# Patient Record
Sex: Male | Born: 1940
Health system: Southern US, Community
[De-identification: ages and names within clinical notes are randomized; demographics above are authoritative.]

## PROBLEM LIST (undated history)

## (undated) DIAGNOSIS — F1010 Alcohol abuse, uncomplicated: Secondary | ICD-10-CM

## (undated) DIAGNOSIS — I10 Essential (primary) hypertension: Secondary | ICD-10-CM

## (undated) HISTORY — PX: LEG SURGERY: SHX1003

## (undated) HISTORY — PX: COLONOSCOPY: SHX174

---

## 2003-01-16 ENCOUNTER — Encounter: Payer: Self-pay | Admitting: Emergency Medicine

## 2003-01-16 ENCOUNTER — Inpatient Hospital Stay (HOSPITAL_COMMUNITY): Admission: EM | Admit: 2003-01-16 | Discharge: 2003-01-22 | Payer: Self-pay | Admitting: Emergency Medicine

## 2003-01-16 ENCOUNTER — Encounter: Payer: Self-pay | Admitting: Orthopedic Surgery

## 2003-01-19 ENCOUNTER — Encounter: Payer: Self-pay | Admitting: Orthopedic Surgery

## 2003-03-06 ENCOUNTER — Encounter: Payer: Self-pay | Admitting: Orthopedic Surgery

## 2003-03-06 ENCOUNTER — Ambulatory Visit (HOSPITAL_COMMUNITY): Admission: RE | Admit: 2003-03-06 | Discharge: 2003-03-06 | Payer: Self-pay | Admitting: Orthopedic Surgery

## 2003-05-09 ENCOUNTER — Ambulatory Visit (HOSPITAL_COMMUNITY): Admission: RE | Admit: 2003-05-09 | Discharge: 2003-05-10 | Payer: Self-pay | Admitting: Orthopedic Surgery

## 2003-05-09 ENCOUNTER — Encounter: Payer: Self-pay | Admitting: Orthopedic Surgery

## 2003-06-11 ENCOUNTER — Encounter (HOSPITAL_COMMUNITY): Admission: RE | Admit: 2003-06-11 | Discharge: 2003-07-11 | Payer: Self-pay | Admitting: Orthopedic Surgery

## 2004-05-08 ENCOUNTER — Ambulatory Visit (HOSPITAL_COMMUNITY): Admission: RE | Admit: 2004-05-08 | Discharge: 2004-05-08 | Payer: Self-pay | Admitting: General Surgery

## 2006-04-06 ENCOUNTER — Emergency Department (HOSPITAL_COMMUNITY): Admission: EM | Admit: 2006-04-06 | Discharge: 2006-04-06 | Payer: Self-pay | Admitting: Emergency Medicine

## 2007-05-02 ENCOUNTER — Telehealth (INDEPENDENT_AMBULATORY_CARE_PROVIDER_SITE_OTHER): Payer: Self-pay | Admitting: *Deleted

## 2007-05-02 ENCOUNTER — Ambulatory Visit: Payer: Self-pay | Admitting: Family Medicine

## 2007-05-02 DIAGNOSIS — R9431 Abnormal electrocardiogram [ECG] [EKG]: Secondary | ICD-10-CM | POA: Insufficient documentation

## 2007-05-02 DIAGNOSIS — F17200 Nicotine dependence, unspecified, uncomplicated: Secondary | ICD-10-CM

## 2007-05-02 DIAGNOSIS — R809 Proteinuria, unspecified: Secondary | ICD-10-CM | POA: Insufficient documentation

## 2007-05-03 ENCOUNTER — Encounter (INDEPENDENT_AMBULATORY_CARE_PROVIDER_SITE_OTHER): Payer: Self-pay | Admitting: Family Medicine

## 2007-05-05 LAB — CONVERTED CEMR LAB
ALT: 14 units/L (ref 0–53)
AST: 18 units/L (ref 0–37)
Albumin: 4.2 g/dL (ref 3.5–5.2)
Alkaline Phosphatase: 71 units/L (ref 39–117)
Basophils Absolute: 0 10*3/uL (ref 0.0–0.1)
Basophils Relative: 0 % (ref 0–1)
Eosinophils Absolute: 0.2 10*3/uL (ref 0.0–0.7)
HDL: 63 mg/dL (ref >39)
LDL Cholesterol: 114 mg/dL — ABNORMAL HIGH (ref 0–99)
MCHC: 31.3 g/dL (ref 30.0–36.0)
MCV: 93 fL (ref 78.0–100.0)
Neutrophils Relative %: 36 % — ABNORMAL LOW (ref 43–77)
Platelets: 316 10*3/uL (ref 150–400)
Potassium: 5.4 meq/L — ABNORMAL HIGH (ref 3.5–5.3)
Sodium: 143 meq/L (ref 135–145)
TSH: 2.466 microintl units/mL (ref 0.350–5.50)
Total Protein: 7.3 g/dL (ref 6.0–8.3)

## 2007-05-09 ENCOUNTER — Telehealth (INDEPENDENT_AMBULATORY_CARE_PROVIDER_SITE_OTHER): Payer: Self-pay | Admitting: *Deleted

## 2007-05-17 ENCOUNTER — Ambulatory Visit: Payer: Self-pay | Admitting: Cardiology

## 2007-05-17 ENCOUNTER — Ambulatory Visit: Payer: Self-pay | Admitting: Family Medicine

## 2007-05-17 DIAGNOSIS — I10 Essential (primary) hypertension: Secondary | ICD-10-CM | POA: Insufficient documentation

## 2007-05-17 DIAGNOSIS — F1010 Alcohol abuse, uncomplicated: Secondary | ICD-10-CM | POA: Insufficient documentation

## 2007-05-17 LAB — CONVERTED CEMR LAB: LDL Goal: 160 mg/dL

## 2007-05-18 ENCOUNTER — Ambulatory Visit: Payer: Self-pay | Admitting: Cardiology

## 2007-05-18 ENCOUNTER — Telehealth (INDEPENDENT_AMBULATORY_CARE_PROVIDER_SITE_OTHER): Payer: Self-pay | Admitting: *Deleted

## 2007-05-18 ENCOUNTER — Ambulatory Visit (HOSPITAL_COMMUNITY): Admission: RE | Admit: 2007-05-18 | Discharge: 2007-05-18 | Payer: Self-pay | Admitting: Cardiology

## 2007-06-29 ENCOUNTER — Telehealth (INDEPENDENT_AMBULATORY_CARE_PROVIDER_SITE_OTHER): Payer: Self-pay | Admitting: *Deleted

## 2007-08-17 ENCOUNTER — Ambulatory Visit: Payer: Self-pay | Admitting: Family Medicine

## 2007-08-17 DIAGNOSIS — E782 Mixed hyperlipidemia: Secondary | ICD-10-CM | POA: Insufficient documentation

## 2007-11-15 ENCOUNTER — Ambulatory Visit: Payer: Self-pay | Admitting: Family Medicine

## 2007-11-16 ENCOUNTER — Telehealth (INDEPENDENT_AMBULATORY_CARE_PROVIDER_SITE_OTHER): Payer: Self-pay | Admitting: *Deleted

## 2007-11-16 LAB — CONVERTED CEMR LAB
Albumin: 4.3 g/dL (ref 3.5–5.2)
CO2: 24 meq/L (ref 19–32)
Cholesterol: 201 mg/dL — ABNORMAL HIGH (ref 0–200)
Glucose, Bld: 105 mg/dL — ABNORMAL HIGH (ref 70–99)
Sodium: 142 meq/L (ref 135–145)
Total Bilirubin: 0.2 mg/dL — ABNORMAL LOW (ref 0.3–1.2)
Total Protein: 7.1 g/dL (ref 6.0–8.3)
Triglycerides: 104 mg/dL (ref <150)
VLDL: 21 mg/dL (ref 0–40)

## 2007-12-15 ENCOUNTER — Telehealth (INDEPENDENT_AMBULATORY_CARE_PROVIDER_SITE_OTHER): Payer: Self-pay | Admitting: Family Medicine

## 2008-04-24 ENCOUNTER — Ambulatory Visit: Payer: Self-pay | Admitting: Family Medicine

## 2008-04-24 DIAGNOSIS — K429 Umbilical hernia without obstruction or gangrene: Secondary | ICD-10-CM | POA: Insufficient documentation

## 2008-04-25 ENCOUNTER — Ambulatory Visit (HOSPITAL_COMMUNITY): Admission: RE | Admit: 2008-04-25 | Discharge: 2008-04-25 | Payer: Self-pay | Admitting: Family Medicine

## 2008-04-25 LAB — CONVERTED CEMR LAB
ALT: 176 units/L — ABNORMAL HIGH (ref 0–53)
AST: 263 units/L — ABNORMAL HIGH (ref 0–37)
Amylase: 39 units/L (ref 0–105)
Basophils Absolute: 0 10*3/uL (ref 0.0–0.1)
Basophils Relative: 0 % (ref 0–1)
Creatinine, Ser: 1.1 mg/dL (ref 0.40–1.50)
Eosinophils Absolute: 0.1 10*3/uL (ref 0.0–0.7)
Eosinophils Relative: 3 % (ref 0–5)
HCT: 48.3 % (ref 39.0–52.0)
HDL: 35 mg/dL — ABNORMAL LOW (ref >39)
Hemoglobin: 16.5 g/dL (ref 13.0–17.0)
Lipase: 117 units/L — ABNORMAL HIGH (ref 0–75)
MCHC: 34.2 g/dL (ref 30.0–36.0)
MCV: 85.3 fL (ref 78.0–100.0)
Monocytes Absolute: 0.5 10*3/uL (ref 0.1–1.0)
RDW: 15.8 % — ABNORMAL HIGH (ref 11.5–15.5)
Total Bilirubin: 1.4 mg/dL — ABNORMAL HIGH (ref 0.3–1.2)
Total CHOL/HDL Ratio: 10.5

## 2008-04-26 ENCOUNTER — Ambulatory Visit: Payer: Self-pay | Admitting: Family Medicine

## 2008-05-01 ENCOUNTER — Ambulatory Visit: Payer: Self-pay | Admitting: Family Medicine

## 2008-05-02 LAB — CONVERTED CEMR LAB
AST: 20 units/L (ref 0–37)
Alkaline Phosphatase: 55 units/L (ref 39–117)
BUN: 15 mg/dL (ref 6–23)
Calcium: 9.1 mg/dL (ref 8.4–10.5)
Chloride: 101 meq/L (ref 96–112)
Creatinine, Ser: 1.12 mg/dL (ref 0.40–1.50)
Glucose, Bld: 103 mg/dL — ABNORMAL HIGH (ref 70–99)

## 2008-05-08 ENCOUNTER — Ambulatory Visit: Payer: Self-pay | Admitting: Family Medicine

## 2008-05-09 LAB — CONVERTED CEMR LAB
Alkaline Phosphatase: 51 units/L (ref 39–117)
Bilirubin, Direct: 0.1 mg/dL (ref 0.0–0.3)
Indirect Bilirubin: 0.2 mg/dL (ref 0.0–0.9)
Total Protein: 6.8 g/dL (ref 6.0–8.3)

## 2008-05-29 ENCOUNTER — Ambulatory Visit: Payer: Self-pay | Admitting: Family Medicine

## 2008-06-06 ENCOUNTER — Encounter (INDEPENDENT_AMBULATORY_CARE_PROVIDER_SITE_OTHER): Payer: Self-pay | Admitting: Family Medicine

## 2008-06-12 ENCOUNTER — Ambulatory Visit: Payer: Self-pay | Admitting: Family Medicine

## 2008-06-13 ENCOUNTER — Encounter (INDEPENDENT_AMBULATORY_CARE_PROVIDER_SITE_OTHER): Payer: Self-pay | Admitting: Family Medicine

## 2008-06-14 LAB — CONVERTED CEMR LAB
AST: 16 units/L (ref 0–37)
Albumin: 4.4 g/dL (ref 3.5–5.2)
BUN: 18 mg/dL (ref 6–23)
CO2: 24 meq/L (ref 19–32)
Calcium: 9.3 mg/dL (ref 8.4–10.5)
Chloride: 101 meq/L (ref 96–112)
Cholesterol: 133 mg/dL (ref 0–200)
Creatinine, Ser: 1.2 mg/dL (ref 0.40–1.50)
Glucose, Bld: 86 mg/dL (ref 70–99)
HDL: 48 mg/dL (ref >39)
Potassium: 4.5 meq/L (ref 3.5–5.3)
Total CHOL/HDL Ratio: 2.8
Triglycerides: 62 mg/dL (ref <150)

## 2008-10-09 ENCOUNTER — Ambulatory Visit: Payer: Self-pay | Admitting: Family Medicine

## 2008-10-12 LAB — CONVERTED CEMR LAB
ALT: <8 U/L
AST: 16 U/L
Albumin: 5 g/dL
Alkaline Phosphatase: 48 U/L
BUN: 28 mg/dL — ABNORMAL HIGH
CO2: 22 meq/L
Calcium: 10.2 mg/dL
Chloride: 102 meq/L
Cholesterol: 173 mg/dL
Creatinine, Ser: 1.27 mg/dL
Glucose, Bld: 101 mg/dL — ABNORMAL HIGH
HDL: 54 mg/dL
LDL Cholesterol: 106 mg/dL — ABNORMAL HIGH
Potassium: 4.8 meq/L
Sodium: 140 meq/L
Total Bilirubin: 0.3 mg/dL
Total CHOL/HDL Ratio: 3.2
Total Protein: 8.3 g/dL
Triglycerides: 65 mg/dL
VLDL: 13 mg/dL

## 2008-11-13 ENCOUNTER — Telehealth (INDEPENDENT_AMBULATORY_CARE_PROVIDER_SITE_OTHER): Payer: Self-pay | Admitting: Family Medicine

## 2009-01-08 ENCOUNTER — Ambulatory Visit: Payer: Self-pay | Admitting: Family Medicine

## 2009-02-04 ENCOUNTER — Telehealth (INDEPENDENT_AMBULATORY_CARE_PROVIDER_SITE_OTHER): Payer: Self-pay | Admitting: *Deleted

## 2009-04-09 ENCOUNTER — Ambulatory Visit: Payer: Self-pay | Admitting: Family Medicine

## 2009-04-09 LAB — CONVERTED CEMR LAB: Cholesterol, target level: 200 mg/dL

## 2009-04-10 ENCOUNTER — Encounter (INDEPENDENT_AMBULATORY_CARE_PROVIDER_SITE_OTHER): Payer: Self-pay | Admitting: Family Medicine

## 2009-04-10 LAB — CONVERTED CEMR LAB
ALT: 9 units/L (ref 0–53)
Basophils Relative: 0 % (ref 0–1)
CO2: 23 meq/L (ref 19–32)
Calcium: 9.5 mg/dL (ref 8.4–10.5)
Chloride: 105 meq/L (ref 96–112)
Cholesterol: 181 mg/dL (ref 0–200)
Eosinophils Absolute: 0.4 10*3/uL (ref 0.0–0.7)
Glucose, Bld: 98 mg/dL (ref 70–99)
Hemoglobin: 12.5 g/dL — ABNORMAL LOW (ref 13.0–17.0)
Lymphs Abs: 3.1 10*3/uL (ref 0.7–4.0)
MCHC: 31.6 g/dL (ref 30.0–36.0)
MCV: 87 fL (ref 78.0–100.0)
Monocytes Absolute: 0.7 10*3/uL (ref 0.1–1.0)
Monocytes Relative: 9 % (ref 3–12)
Neutro Abs: 3.4 10*3/uL (ref 1.7–7.7)
Neutrophils Relative %: 45 % (ref 43–77)
RBC: 4.54 M/uL (ref 4.22–5.81)
Sodium: 140 meq/L (ref 135–145)
Total Protein: 6.6 g/dL (ref 6.0–8.3)
Triglycerides: 79 mg/dL (ref <150)
WBC: 7.7 10*3/uL (ref 4.0–10.5)

## 2009-04-11 LAB — CONVERTED CEMR LAB
Ferritin: 457 ng/mL — ABNORMAL HIGH (ref 22–322)
Folate: 1.4 ng/mL
Vitamin B-12: 543 pg/mL (ref 211–911)

## 2009-04-17 ENCOUNTER — Telehealth (INDEPENDENT_AMBULATORY_CARE_PROVIDER_SITE_OTHER): Payer: Self-pay | Admitting: Family Medicine

## 2009-04-17 ENCOUNTER — Ambulatory Visit: Payer: Self-pay | Admitting: Family Medicine

## 2009-04-17 DIAGNOSIS — M545 Low back pain, unspecified: Secondary | ICD-10-CM | POA: Insufficient documentation

## 2010-04-29 ENCOUNTER — Emergency Department (HOSPITAL_COMMUNITY): Admission: EM | Admit: 2010-04-29 | Discharge: 2010-04-29 | Payer: Self-pay | Admitting: Emergency Medicine

## 2010-11-06 LAB — DIFFERENTIAL
Basophils Absolute: 0 10*3/uL (ref 0.0–0.1)
Eosinophils Absolute: 0 10*3/uL (ref 0.0–0.7)
Eosinophils Relative: 0 % (ref 0–5)
Lymphocytes Relative: 40 % (ref 12–46)
Monocytes Absolute: 0.6 10*3/uL (ref 0.1–1.0)

## 2010-11-06 LAB — POCT CARDIAC MARKERS
CKMB, poc: 2.4 ng/mL (ref 1.0–8.0)
Myoglobin, poc: 82.5 ng/mL (ref 12–200)
Myoglobin, poc: 85.3 ng/mL (ref 12–200)

## 2010-11-06 LAB — COMPREHENSIVE METABOLIC PANEL
ALT: 31 U/L (ref 0–53)
AST: 50 U/L — ABNORMAL HIGH (ref 0–37)
Albumin: 4.3 g/dL (ref 3.5–5.2)
Chloride: 100 mEq/L (ref 96–112)
Creatinine, Ser: 0.82 mg/dL (ref 0.4–1.5)
GFR calc Af Amer: >60 mL/min (ref >60)
Potassium: 4.5 mEq/L (ref 3.5–5.1)
Sodium: 139 mEq/L (ref 135–145)
Total Bilirubin: 0.9 mg/dL (ref 0.3–1.2)

## 2010-11-06 LAB — CBC
Hemoglobin: 15.5 g/dL (ref 13.0–17.0)
MCH: 29.6 pg (ref 26.0–34.0)
Platelets: 215 10*3/uL (ref 150–400)
RBC: 5.22 MIL/uL (ref 4.22–5.81)
WBC: 7.2 10*3/uL (ref 4.0–10.5)
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
## 2011-01-06 NOTE — Procedures (Signed)
[name], [name]                ACCOUNT NO.:  [id number]   MEDICAL RECORD NO.:  [id number]          PATIENT TYPE:  OUT   LOCATION:  RAD                           FACILITY:  [facility]   PHYSICIAN:  Gerrit Friends. Dietrich Pates, MD, FACCDATE OF BIRTH:  1941/01/19   DATE OF PROCEDURE:  05/18/2007  DATE OF DISCHARGE:                                ECHOCARDIOGRAM   PROCEDURE:  Echocardiogram.   REFERRING PHYSICIAN:  Franchot Heidelberg, M.D.   CLINICAL DATA:  A 70 year old gentleman with abnormal EKG and evidence  for LVH. M-mode aorta 3.8, left atrium 3.5, septum 1.2, posterior wall  1.1, LV diastole 4.1, LV systole 2.9.   1. Technically adequate echocardiographic study.  2. Left atrial size at the upper limit of normal to mildly increased;      normal right atrium.  3. Normal right ventricular size and function; borderline RVH.  4. Normal trileaflet aortic valve; normal proximal ascending aorta.  5. Normal tricuspid and mitral valves.  6. Pulmonic valve and proximal pulmonary artery not ideally imaged,      but are grossly normal.  7. Normal left ventricular size; borderline hypertrophy; normal      regional and global function.  8. Normal IVC.      Gerrit Friends. Dietrich Pates, MD, [facility]  Electronically Signed     RMR/MEDQ  D:  05/18/2007  T:  05/19/2007  Job:  [id number]

## 2011-01-09 NOTE — H&P (Signed)
[name], BOYS                            ACCOUNT NO.:  [id number]   MEDICAL RECORD NO.:  [id number]                   PATIENT TYPE:   LOCATION:                                       FACILITY:   PHYSICIAN:  Jerolyn Shin C. Katrinka Blazing, M.D.                DATE OF BIRTH:   DATE OF ADMISSION:  05/08/2004  DATE OF DISCHARGE:                                HISTORY & PHYSICAL   A 70 year old male referred for screening colonoscopy.  Weight is stable.  No history of rectal bleeding.  Stool guaiac negative.  Negative family  history of colon cancer.   PAST MEDICAL HISTORY:  He is otherwise healthy except for chronic ventral  hernia.   ALLERGIES:  He has no known drug allergies.   CURRENT MEDICATIONS:  Takes no chronic medications.   SOCIAL HISTORY:  He is married.  Former use of alcohol and cigarettes.   FAMILY HISTORY:  Unremarkable according to the patient.   PHYSICAL EXAMINATION:  VITAL SIGNS:  Blood pressure 110/70, pulse 80,  respirations 18, weight 148 pounds.  HEENT:  Unremarkable.  NECK:  Supple.  No JVD, bruit, adenopathy, or thyromegaly.  CHEST:  Clear to auscultation.  No rales, rubs, rhonchi, or wheezes.  HEART:  Regular rate and rhythm without murmurs, rubs, or gallops.  ABDOMEN:  Soft, nontender.  No masses.  Supraumbilical ventral hernia.  EXTREMITIES:  No clubbing, cyanosis, edema.  NEUROLOGIC:  No focal motor, sensory, or cerebellar deficits.   IMPRESSION:  1.  Need for screening colonoscopy.  2.  Ventral hernia.   PLAN:  Screening colonoscopy.     ___________________________________________                                         Dirk Dress. Katrinka Blazing, M.D.   LCS/MEDQ  D:  05/08/2004  T:  05/08/2004  Job:  [id number]   cc:   [facility]  Fax: [phone]

## 2011-01-09 NOTE — Op Note (Signed)
   NAMEDESMOND, SZABO                            ACCOUNT NO.:  [id number]   MEDICAL RECORD NO.:  [id number]                   PATIENT TYPE:  INP   LOCATION:  5024                                 FACILITY:  [facility]   PHYSICIAN:  Burnard Bunting, M.D.                 DATE OF BIRTH:  03/16/1941   DATE OF PROCEDURE:  01/19/2003  DATE OF DISCHARGE:                                 OPERATIVE REPORT   PREOPERATIVE DIAGNOSIS:  Open right tibia-fibula fracture.   POSTOPERATIVE DIAGNOSIS:  Open right tibia-fibula fracture.   PROCEDURES:  1. Repeat irrigation and debridement of open wound.  2. Removal of antibiotic beads.  3. Delayed primary closure of the skin over a drain.   SURGEON:  Burnard Bunting, M.D.   ESTIMATED BLOOD LOSS:  50 mL.   DRAINS:  Flat Blake drain x1.   DESCRIPTION OF PROCEDURE:  The patient was brought to the operating room,  where general endotracheal anesthesia was induced.  Preoperative IV  antibiotics were administered.  The right leg was prepped with Hibiclens and  saline and draped in a sterile manner.  This included the external fixator.  The antibiotic bead pouch was removed.  The screws were loosened in order to  allow for the intramedullary beads to be pulled out of the distal end of the  proximal fractured tibia.  Twenty-three beads were removed.  At this time  repeat debridement was performed of devitalized skin, subcutaneous tissue,  muscle, and bone.  All in all, the wound looked much healthier today than it  did three days ago.  The distal tibia was feathered with an osteotome to  promote healing.  The incision was again irrigated with pulsatile lavage  solution, 6 L of normal saline.  At this time the fracture was re-reduced  under fluoroscopic guidance.  Delayed primary closure was achieved using  near-far, near-far suture fashion with minimal tension on the skin.  A flat  Blake drain was placed.  The fracture was then re-reduced and the external  fixator  was tightened.  The foot was then re-dressed.  Pin sites were clean.  The patient was then transferred to the recovery room in stable condition.                                               Burnard Bunting, M.D.    GSD/MEDQ  D:  01/20/2003  T:  01/21/2003  Job:  [id number]

## 2011-01-09 NOTE — Op Note (Signed)
NAMEHAVIER, DEEB                            ACCOUNT NO.:  [id number]   MEDICAL RECORD NO.:  [id number]                   PATIENT TYPE:  OIB   LOCATION:  5038                                 FACILITY:  [facility]   PHYSICIAN:  Burnard Bunting, M.D.                 DATE OF BIRTH:  [birth year]   DATE OF PROCEDURE:  05/09/2003  DATE OF DISCHARGE:  05/10/2003                                 OPERATIVE REPORT   PREOPERATIVE DIAGNOSIS:  Left tibia-fibula delayed union.   POSTOPERATIVE DIAGNOSIS:  Left tibia-fibula delayed union.   PROCEDURE:  Left tibia-fibula reamed intramedullary nail.   SURGEON:  Burnard Bunting, M.D.   ASSISTANT:  Jerolyn Shin. Tresa Res, M.D.   ANESTHESIA:  General endotracheal anesthesia.   ESTIMATED BLOOD LOSS:  100 mL.   DRAINS:  None.   DESCRIPTION OF PROCEDURE:  The patient was brought to the operating room  where general endotracheal anesthesia was induced. Preoperative IV  antibiotics were administered. The left leg was prepped with Duraprep  solution and draped in a sterile manner. It was noted that  the patient had  about a 15 degree knee flexion contracture. He did have instability of the  distal tib-fib nonunion site. The operative field was covered with [place]  after prepping with Duraprep.   An incision was made from the anterior pole of the patella to the tibial  tubercle. The skin and subcutaneous tissue was sharply divided. The patellar  tendon was sharply divided and the fat pad was elevated off the  anterior  aspect of the tibia. A guide pin was placed into  the tibial canal with the  correct placement  checked in the AP and lateral planes.   The canal was then opened up and  the guide pin was placed across the  fracture site with the assistance  of the fracture reducer. Good placement  of  the guide pin in the center center portion of the distal fragment was  achieved. At this time reaming was performed up to 12.5 mm to accept a 11.5-  mm nail. Sizing  was measured using the accompanying jig and the nail was  between sizes, and thus the shorter size was chosen.   The nail was then placed over the guide pin across the fracture site coming  to within 3 to 4 mm of the articular surface. At this time 2 proximal  interlocking screws were placed. This was done under fluoroscopic guidance  through the jig. Three distal  interlocking screws were placed, 2 transverse  from medial to lateral and 1 transverse from anterior to posterior. The  transverse screws were placed under fluoroscopic guidance using free hand  technique.   The anterior posterior screw was placed after making an incision in the  anterolateral aspect of the ankle. The skin and subcutaneous tissue were  sharply divided and blunt dissection was then performed  in order to  mobilize the neurovascular structures as well as from the site of the screw  placement. Three secure screws were placed in the distal fragment. Excellent  stability was achieved at the fracture site. All incisions were irrigated at  this time. The end cap was placed on the  proximal aspect of the  nail.   The patellar tendon region was closed using interrupted inverted 0 Vicryl  suture followed  by interrupted inverted 2-0 Vicryl suture and a 3-0 pullout  Prolene. A combination of 2-0 Vicryl and 3-0 nylon sutures were then used to  close the interlocking screw incisions. Ioban and a bulky dressing were  placed.   The patient tolerated the procedure well without immediate complications. He  was transferred to the recovery room in stable condition.                                               Burnard Bunting, M.D.    GSD/MEDQ  D:  05/12/2003  T:  05/13/2003  Job:  [id number]

## 2011-01-09 NOTE — Op Note (Signed)
   NAMEAVIK, LEONI                            ACCOUNT NO.:  [id number]   MEDICAL RECORD NO.:  [id number]                   PATIENT TYPE:  OIB   LOCATION:  2899                                 FACILITY:  [facility]   PHYSICIAN:  Burnard Bunting, M.D.                 DATE OF BIRTH:  08-Dec-1940   DATE OF PROCEDURE:  03/06/2003  DATE OF DISCHARGE:                                 OPERATIVE REPORT   PREOPERATIVE DIAGNOSIS:  Retained external fixator, left ankle.   POSTOPERATIVE DIAGNOSIS:  Retained external fixator, left ankle.   OPERATION:  Removal of external fixator with short leg casting.   SURGEON:  Burnard Bunting, M.D.   ANESTHESIA:  LMA   DESCRIPTION OF PROCEDURE:  The patient was brought to the operating room,  where LMA anesthesia was induced.  Left leg external fixator was removed.  Pin sites were then irrigated and covered with Bactroban and Xeroform and  sponges.  At this time, the ankle was examined under fluoroscopy.  There was  about 1 mm of motion noted at the fracture site.  The leg was then  subsequently placed into a short leg cast with the bones reapproximated.  The patient tolerated the procedure well without immediate complications.  Transferred to the recovery room in stable condition.                                               Burnard Bunting, M.D.    GSD/MEDQ  D:  03/06/2003  T:  03/06/2003  Job:  [id number]

## 2011-01-09 NOTE — H&P (Signed)
   [name], [name]                            ACCOUNT NO.:  [id number]   MEDICAL RECORD NO.:  [id number]                   PATIENT TYPE:  EMS   LOCATION:  MAJO                                 FACILITY:  [facility]   PHYSICIAN:  Burnard Bunting, M.D.                 DATE OF BIRTH:  09/07/1940   DATE OF ADMISSION:  01/16/2003  DATE OF DISCHARGE:                                HISTORY & PHYSICAL   CHIEF COMPLAINT:  Right leg pain.   HISTORY OF PRESENT ILLNESS:  The patient is a 70 year old patient who fell  off of a ladder at about 11:30 a.m. today.  He injured his right leg.  He  denies any other orthopedic complaints.  There was no loss of consciousness.  He denies any neck pain or groin pain.   PAST MEDICAL/SURGICAL HISTORY:  Unremarkable.   SOCIAL HISTORY:  The patient does smoke.  He lives with his wife at home.  He last ate this morning around 7 or 8 a.m.   PHYSICAL EXAMINATION:  GENERAL:  He is alert and oriented x3.  CHEST:  Clear to auscultation.  HEART:  A regular rate and rhythm.  All sounds benign.  NECK:  He has no neck pain.  EXTREMITIES/NEUROLOGIC:  He has no groin pain with external rotation of  either leg.  He does have an open grade 2 tibial/fibular fracture with  palpable pedal pulses and intact sensation on the dorsal plantar aspect of  the foot on the right-hand side.  Motor function is difficult to assess due  to the patient's distal tibial/fibular fracture.  He has no knee effusion on  the right-hand side.  X-rays demonstrate a comminuted right distal tibial/fibular fracture.   IMPRESSION:  Right tibial/fibular fracture, comminuted, open.   PLAN:  Incision and drainage with external fixation and antibiotic IV versus  wound VAC.  The risks and benefits are discussed.  The primary risks include infection,  non-union, malunion, nerve and vessel damage, resulting in the need for  possible amputation.  The patient understands and will proceed with surgery.  Labs  are pending at the time of this dictation, although his hematocrit is  41.7, white count 10.7.                                                  Burnard Bunting, M.D.    GSD/MEDQ  D:  01/16/2003  T:  01/16/2003  Job:  [id number]

## 2011-01-09 NOTE — Discharge Summary (Signed)
   NAMEJOSIAN, LANESE                            ACCOUNT NO.:  [id number]   MEDICAL RECORD NO.:  [id number]                   PATIENT TYPE:  INP   LOCATION:  5024                                 FACILITY:  [facility]   PHYSICIAN:  Burnard Bunting, M.D.                 DATE OF BIRTH:  [birth year]   DATE OF ADMISSION:  01/16/2003  DATE OF DISCHARGE:  01/22/2003                                 DISCHARGE SUMMARY   DISCHARGE DIAGNOSIS:  Open left ankle tibia/fibula fracture.   OPERATION/PROCEDURES:  1. Left leg I&D with external fixation and antibiotic bead placement, Jan 16, 2003.  2. Repeat I&D with removal of beads and delayed primary closure performed     Jan 19, 2003.   HOSPITAL COURSE:  Mr. Oelkers is a 70 year old patient who fell off a ladder at  11:30 on the day of admission.  He complains of left foot pain.  The patient  had a 2B open left distal tib/fib fracture.  The patient was admitted to the  orthopedic service.  IV antibiotics were initiated.  Debridement was  performed on Jan 16, 2003. Antibiotic beads were placed at that time.  The  patient was maintained on triple antibiotic coverage.  Left foot was  perfused and sensate postop.  The patient was treated and subsequently  started on Lovenox for DVT prophylaxis.  He subsequently underwent repeat  I&D and removal of antibiotic beads and delayed primary closure Jan 19, 2003.  The patient's leg drain was discontinued on Jan 21, 2003.  He  continued on IV antibiotics throughout his hospitalization.  Pin site care  was initiated and taught to his wife prior to discharge.  The patient  discharged home Jan 22, 2003.  He will be nonweightbearing on that left  lower extremity.  He will do pin site two times a day.  Discharged home on  Lovenox for DVT prophylaxis.  He will need to come back in seven days for  inspection of the incision.  He was discharged home in good condition.   Discharge medications further include Lovenox,  Percocet as well as  preadmission medications.                                                 Burnard Bunting, M.D.    GSD/MEDQ  D:  02/12/2003  T:  02/13/2003  Job:  [id number]

## 2011-01-09 NOTE — Op Note (Signed)
[name], [name]                            ACCOUNT NO.:  [id number]   MEDICAL RECORD NO.:  [id number]                   PATIENT TYPE:  INP   LOCATION:  5024                                 FACILITY:  [facility]   PHYSICIAN:  Burnard Bunting, M.D.                 DATE OF BIRTH:  1941-06-06   DATE OF PROCEDURE:  01/16/2003  DATE OF DISCHARGE:                                 OPERATIVE REPORT   PREOPERATIVE DIAGNOSIS:  Open left distal tibia-fibula fracture.   POSTOPERATIVE DIAGNOSIS:  Open left distal tibia-fibula fracture.   PROCEDURES:  1. Irrigation and debridement of devitalized skin, subcutaneous tissue,     fascia, and bone, with removal of gross contamination of exposed     surfaces.  2. Placement of antibiotic beads.  3. Open reduction of the fracture.  4. Placement of external fixation.   SURGEON:  Burnard Bunting, M.D.   ASSISTANT:  Jerolyn Shin. Tresa Res, M.D.   ANESTHESIA:  General endotracheal.   ESTIMATED BLOOD LOSS:  100 mL.   DRAINS:  None.   ANTIBIOTICS:  Antibiotic bead patches utilized using Tobramycin beads 1.2 g  in one bag of cement.  Beads:  #23.   DESCRIPTION OF PROCEDURE:  The patient was brought to the operating room,  where general endotracheal anesthesia was induced.  Prior to prepping and  draping, gross contamination from the exposed tibia was removed.  The  patient had about 4 cm of the distal end of his tibia at the axial-  metaphyseal junction exposed through a stellate laceration measuring  approximately 8 cm over the medial distal aspect of the tibia.  Gross  contamination was removed.  The foot and leg were then prepped with  Hibiclens solution and draped in a sterile manner.  At this time the  incision was extended proximally and distally.  Care was taken to avoid  injury to the saphenous vein and nerve medially.  Skin flaps were removed  and devitalized skin, subcutaneous tissue, fascia, and bone were removed.  The bone had actually acted more  or less as a hollow-bore needle puncturing  the ground.  Dirt and debris were found within the intramedullary canal for  1-2 cm.  This was meticulously removed with a curette and affected bone was  removed with the rongeurs.  Affected grossly contaminated muscle and fascia  were also sharply debrided.  This meticulous debridement took about 45  minutes to an hour.  The foot was flexed laterally in order to assure  visualization of all potential contaminated regions.  Following this  meticulous debridement, irrigation was performed.  First irrigation utilized  5 mL of Dial soap placed into a 3 L bag of irrigation.  Second 3 L bag of  irrigating solution was plain normal saline.  This was placed under low  pressure and pulsatile lavage.  Following debridement and subsequent  irrigation, an external  fixator delta frame was placed with pins placed in  both the coronal and sagittal plane.  Once pin was placed through the  calcaneus under fluoroscopic guidance transversely.  Two sagittal pins were  then placed into the tibia proximally.  At this time after placement of the  pins and provisional stabilization could be achieved, antibiotic beads were  fashioned by making Tobramycin beads onto a nylon suture.  The beads were  then placed into the intramedullary canal and around the wound itself.  The  incision made for debridement was then closed using a 3-0 nylon suture.  Several far-near, near-far sutures were placed to maintain some degree of  tension over the skin.  The wound itself was left open.  The antibiotic bead  pouch was sealed with Ioban and Benzoin.  The fracture was then reduced  under direct visualization to anatomic position with the fibula being  brought out to length.  This was confirmed in the AP and lateral planes  under fluoroscopy.  The pins were then tightened and a bulky dressing was  placed on the foot.  The patient tolerated the procedure well without  immediate  complications.                                               Burnard Bunting, M.D.    GSD/MEDQ  D:  01/20/2003  T:  01/21/2003  Job:  [id number]

## 2011-09-26 ENCOUNTER — Emergency Department (HOSPITAL_COMMUNITY)
Admission: EM | Admit: 2011-09-26 | Discharge: 2011-09-26 | Disposition: A | Discharge status: Home / Self Care | Payer: Medicare Other | Attending: Emergency Medicine | Admitting: Emergency Medicine

## 2011-09-26 ENCOUNTER — Emergency Department (HOSPITAL_COMMUNITY): Payer: Medicare Other

## 2011-09-26 ENCOUNTER — Encounter (HOSPITAL_COMMUNITY): Payer: Self-pay | Admitting: *Deleted

## 2011-09-26 DIAGNOSIS — W1809XA Striking against other object with subsequent fall, initial encounter: Secondary | ICD-10-CM | POA: Insufficient documentation

## 2011-09-26 DIAGNOSIS — Y92009 Unspecified place in unspecified non-institutional (private) residence as the place of occurrence of the external cause: Secondary | ICD-10-CM | POA: Insufficient documentation

## 2011-09-26 DIAGNOSIS — S0180XA Unspecified open wound of other part of head, initial encounter: Secondary | ICD-10-CM | POA: Insufficient documentation

## 2011-09-26 DIAGNOSIS — S0181XA Laceration without foreign body of other part of head, initial encounter: Secondary | ICD-10-CM

## 2011-09-26 DIAGNOSIS — W19XXXA Unspecified fall, initial encounter: Secondary | ICD-10-CM

## 2011-09-26 DIAGNOSIS — I10 Essential (primary) hypertension: Secondary | ICD-10-CM | POA: Insufficient documentation

## 2011-09-26 DIAGNOSIS — M47812 Spondylosis without myelopathy or radiculopathy, cervical region: Secondary | ICD-10-CM | POA: Insufficient documentation

## 2011-09-26 DIAGNOSIS — F10929 Alcohol use, unspecified with intoxication, unspecified: Secondary | ICD-10-CM

## 2011-09-26 DIAGNOSIS — S0990XA Unspecified injury of head, initial encounter: Secondary | ICD-10-CM | POA: Insufficient documentation

## 2011-09-26 HISTORY — DX: Essential (primary) hypertension: I10

## 2011-09-26 LAB — BASIC METABOLIC PANEL
Chloride: 100 mEq/L (ref 96–112)
GFR calc Af Amer: >90 mL/min (ref >90)
Potassium: 4.2 mEq/L (ref 3.5–5.1)

## 2011-09-26 LAB — CBC
HCT: 42.5 % (ref 39.0–52.0)
Platelets: 283 10*3/uL (ref 150–400)
RDW: 16.3 % — ABNORMAL HIGH (ref 11.5–15.5)
WBC: 9.6 10*3/uL (ref 4.0–10.5)

## 2011-09-26 LAB — ETHANOL: Alcohol, Ethyl (B): 323 mg/dL — ABNORMAL HIGH (ref 0–11)

## 2011-09-26 MED ORDER — DOUBLE ANTIBIOTIC 500-10000 UNIT/GM EX OINT
TOPICAL_OINTMENT | Freq: Once | CUTANEOUS | Status: AC
  Administered 2011-09-26: 1 via TOPICAL
  Filled 2011-09-26: qty 1

## 2011-09-26 NOTE — ED Notes (Signed)
Per EMS - pt from home - pt w/ heavy ETOH use tonight, pt states "he missed stepped" causing pt to fall and hit his left forehead on the floor - pt denies LOC or neck/back pain. EMS noted copious amounts of blood on scene.

## 2011-09-26 NOTE — ED Notes (Signed)
Lac above left eye cleaned. Polysporin applied

## 2011-09-26 NOTE — ED Provider Notes (Addendum)
History     CSN: [id number]  Arrival date & time 09/26/11  0153   First MD Initiated Contact with Patient 09/26/11 0158      Chief Complaint  Patient presents with  . Alcohol Intoxication  . Head Injury    (Consider location/radiation/quality/duration/timing/severity/associated sxs/prior treatment) The history is provided by the patient and the EMS personnel.  patient is to drinking alcohol heavily fell on some steps striking his for head and chin on the floor no loss of consciousness patient denies neck or back pain EMS reported copious amount of blood at the scene. She also would complain of some mild anterior chest pain soreness from the fall. Patient states tetanus is up-to-date had 13 years ago. EMS reported laceration above left eye and under chin.  Patient denies bowel pain nausea vomiting neck pain back pain extremity pain.  Past Medical History  Diagnosis Date  . Hypertension     History reviewed. No pertinent past surgical history.  History reviewed. No pertinent family history.  History  Substance Use Topics  . Smoking status: Current Everyday Smoker    Types: Cigars  . Smokeless tobacco: Not on file  . Alcohol Use: Yes      Review of Systems  Constitutional: Negative for fever.  HENT: Negative for congestion, neck pain and neck stiffness.   Eyes: Negative for photophobia, redness and visual disturbance.  Respiratory: Negative for cough and shortness of breath.   Cardiovascular: Negative for chest pain.  Gastrointestinal: Negative for nausea, vomiting and abdominal pain.  Genitourinary: Negative for dysuria.  Musculoskeletal: Negative for back pain.  Skin: Positive for wound. Negative for rash.  Neurological: Negative for dizziness, weakness and headaches.  Hematological: Does not bruise/bleed easily.    Allergies  Review of patient's allergies indicates no known allergies.  Home Medications  No current outpatient prescriptions on file.  BP 153/53   Pulse 70  Temp(Src) 98.9 F (37.2 C) (Oral)  Resp 18  SpO2 100%  Physical Exam  Nursing note and vitals reviewed. Constitutional: He appears well-developed and well-nourished.  HENT:  Mouth/Throat: Oropharynx is clear and moist.       5 cm laceration above left eye along the eyebrow area and 2.5 cm laceration under chin both actively bleeding. No other trauma  Eyes: Conjunctivae and EOM are normal. Pupils are equal, round, and reactive to light.  Neck: Normal range of motion. Neck supple.  Cardiovascular: Normal rate, regular rhythm, normal heart sounds and intact distal pulses.   No murmur heard. Pulmonary/Chest: Effort normal and breath sounds normal. He has no wheezes. He has no rales.  Abdominal: Soft. Bowel sounds are normal. There is no tenderness.  Musculoskeletal: Normal range of motion. He exhibits no tenderness.  Neurological: He is alert. No cranial nerve deficit. He exhibits normal muscle tone. Coordination normal.  Skin: Skin is warm. No rash noted.    ED Course  Wound repair Date/Time: 09/26/2011 8:23 AM Performed by: Shelda Jakes. Authorized by: Shelda Jakes Consent: Verbal consent obtained. Written consent not obtained. The procedure was performed in an emergent situation. Risks and benefits: risks, benefits and alternatives were discussed Consent given by: patient Patient understanding: patient states understanding of the procedure being performed Patient identity confirmed: verbally with patient Time out: Immediately prior to procedure a "time out" was called to verify the correct patient, procedure, equipment, support staff and site/side marked as required. Preparation: Patient was prepped and draped in the usual sterile fashion. Local anesthesia used: yes Local anesthetic:  lidocaine 2% with epinephrine Anesthetic total: 4 ml Patient sedated: no Patient tolerance: Patient tolerated the procedure well with no immediate complications. Comments: 5  cm laceration above left eye irregular partially to eyebrow some through eyelid repaired with 6-0 Prolene 10 sutures applied. Area was irrigated with saline skin edges prepped with Betadine. Simple erupted sutures.   Separate chin laceration 2.5 cm irregular deep, repaired with 6-0 Prolene 5 sutures placed as above anesthetized with 2% lidocaine with epi total of both wounds of 4 cc used. This wound was irrigated with saline and wound edges the cleaned with Betadine.   (including critical care time)  Labs Reviewed  ETHANOL - Abnormal; Notable for the following:    Alcohol, Ethyl (B) 323 (*)    All other components within normal limits  CBC - Abnormal; Notable for the following:    RDW 16.3 (*)    All other components within normal limits  BASIC METABOLIC PANEL - Abnormal; Notable for the following:    GFR calc non Af Amer 85 (*)    All other components within normal limits   Dg Chest 2 View  09/26/2011  *RADIOLOGY REPORT*  Clinical Data: Chest pain, EtOH  CHEST - 2 VIEW  Comparison: 04/29/2010  Findings: Bibasilar interstitial prominence.  Heart size upper normal limits.  Central peribronchial cuffing.  No pneumothorax. No pleural effusion.  No acute osseous abnormality.  IMPRESSION: Bibasilar interstitial prominence and peribronchial cuffing may represent edema or infection/bronchitis.  Original Report Authenticated By: Waneta Martins, M.D.   Ct Head Wo Contrast  09/26/2011  *RADIOLOGY REPORT*  Clinical Data: Status post fall, trauma to the head and chin.  CT CERVICAL SPINE WITHOUT CONTRAST,CT HEAD WITHOUT CONTRAST  Technique:  Multidetector CT imaging of the cervical spine was performed. Multiplanar CT image reconstructions were also generated.,Technique:  Contiguous axial images were obtained from the base of the skull through the vertex without contrast.  Comparison: None.  Findings:  Head:  Remote left basal ganglia lacunar infarction.  Mild periventricular and subcortical white matter  hypodensities. Otherwise, there are is no evidence for acute hemorrhage, hydrocephalus, mass lesion, or abnormal extra-axial fluid collection.  No definite CT evidence for acute infarction.  No displaced calvarial fracture.  Mild mucosal thickening right maxillary sinus.  Otherwise, the paranasal sinuses and mastoid air cells are predominately clear.  Cervical spine:  Maintained craniocervical relationship.  Advanced multilevel degenerative changes and disc osteophyte complexes, resulting in multilevel mild to moderate central canal narrowing. No acute fracture or dislocation identified.  No prevertebral soft tissue swelling.  Nodular right upper lobe opacity measures 1.3 cm, relatively flat appearance on the coronal reformats.  IMPRESSION: Remote left basal ganglia lacunar infarction.  No definite acute intracranial abnormality.  Multilevel degenerative changes of the cervical spine.  No definite acute fracture or dislocation identified.  Nodular opacity in the right lung apex.  Likely reflects scarring. However, a short-term (2-3 month) follow-up is recommended to better characterize and document stability.  Original Report Authenticated By: Waneta Martins, M.D.   Ct Cervical Spine Wo Contrast  09/26/2011  *RADIOLOGY REPORT*  Clinical Data: Status post fall, trauma to the head and chin.  CT CERVICAL SPINE WITHOUT CONTRAST,CT HEAD WITHOUT CONTRAST  Technique:  Multidetector CT imaging of the cervical spine was performed. Multiplanar CT image reconstructions were also generated.,Technique:  Contiguous axial images were obtained from the base of the skull through the vertex without contrast.  Comparison: None.  Findings:  Head:  Remote left basal ganglia  lacunar infarction.  Mild periventricular and subcortical white matter hypodensities. Otherwise, there are is no evidence for acute hemorrhage, hydrocephalus, mass lesion, or abnormal extra-axial fluid collection.  No definite CT evidence for acute infarction.   No displaced calvarial fracture.  Mild mucosal thickening right maxillary sinus.  Otherwise, the paranasal sinuses and mastoid air cells are predominately clear.  Cervical spine:  Maintained craniocervical relationship.  Advanced multilevel degenerative changes and disc osteophyte complexes, resulting in multilevel mild to moderate central canal narrowing. No acute fracture or dislocation identified.  No prevertebral soft tissue swelling.  Nodular right upper lobe opacity measures 1.3 cm, relatively flat appearance on the coronal reformats.  IMPRESSION: Remote left basal ganglia lacunar infarction.  No definite acute intracranial abnormality.  Multilevel degenerative changes of the cervical spine.  No definite acute fracture or dislocation identified.  Nodular opacity in the right lung apex.  Likely reflects scarring. However, a short-term (2-3 month) follow-up is recommended to better characterize and document stability.  Original Report Authenticated By: Waneta Martins, M.D.     1. Alcohol intoxication   2. Fall   3. Laceration of face, multiple sites       MDM   Alcohol intoxication status post fall and some steps at home resulting in laceration above left eye and under chin. Head CT cervical spine CT negative for significant injury chest x-ray negative. Was repaired as stated above. At discharge patient alert and functional.         Shelda Jakes, MD 09/26/11 [id number]  Shelda Jakes, MD 09/26/11 [phone]

## 2011-10-02 ENCOUNTER — Encounter (HOSPITAL_COMMUNITY): Payer: Self-pay | Admitting: Emergency Medicine

## 2011-10-02 ENCOUNTER — Emergency Department (HOSPITAL_COMMUNITY)
Admission: EM | Admit: 2011-10-02 | Discharge: 2011-10-02 | Disposition: A | Discharge status: Home / Self Care | Payer: Medicare Other | Attending: Emergency Medicine | Admitting: Emergency Medicine

## 2011-10-02 DIAGNOSIS — Z4802 Encounter for removal of sutures: Secondary | ICD-10-CM | POA: Insufficient documentation

## 2011-10-02 NOTE — ED Provider Notes (Signed)
History     CSN: [id number]  Arrival date & time 10/02/11  1007   First MD Initiated Contact with Patient 10/02/11 1018      Chief Complaint  Patient presents with  . Suture / Staple Removal    (Consider location/radiation/quality/duration/timing/severity/associated sxs/prior treatment) Patient is a 71 y.o. male presenting with suture removal. The history is provided by the patient.  Suture / Staple Removal  The sutures were placed 3 to 6 days ago. There has been no treatment since the wound repair. There has been no drainage from the wound. There is no redness present. There is no swelling present. The pain has no pain.    Past Medical History  Diagnosis Date  . Hypertension     Past Surgical History  Procedure Date  . Leg surgery     left    Family History  Problem Relation Age of Onset  . Stroke Mother   . Heart failure Father     History  Substance Use Topics  . Smoking status: Current Everyday Smoker    Types: Cigars  . Smokeless tobacco: Never Used  . Alcohol Use: 4.2 oz/week    7 Glasses of wine per week      Review of Systems  Constitutional: Negative for fever.  HENT: Negative for sore throat, facial swelling and neck pain.   Eyes: Negative.   Respiratory: Negative.   Cardiovascular: Negative for chest pain.  Gastrointestinal: Negative.   Genitourinary: Negative.   Musculoskeletal: Negative for arthralgias.  Skin: Positive for wound. Negative for rash.  Neurological: Negative for headaches.  Hematological: Negative.   Psychiatric/Behavioral: Negative.     Allergies  Review of patient's allergies indicates no known allergies.  Home Medications  No current outpatient prescriptions on file.  There were no vitals taken for this visit.  Physical Exam  Nursing note and vitals reviewed. Constitutional: He is oriented to person, place, and time. He appears well-developed and well-nourished.  HENT:  Head: Normocephalic and atraumatic.  Eyes:  Conjunctivae are normal.  Neck: Normal range of motion.  Cardiovascular: Normal rate.   Pulmonary/Chest: Effort normal.  Musculoskeletal: Normal range of motion.  Neurological: He is alert and oriented to person, place, and time.  Skin: Skin is warm and dry.       Well-healing lacerations to left eyebrow and upper eyelid and along anterior edge of the chin.  Psychiatric: He has a normal mood and affect.    ED Course  Procedures (including critical care time)  Labs Reviewed - No data to display No results found.   1. Visit for suture removal     SUTURE REMOVAL Performed by: Candis Musa  Consent: Verbal consent obtained. Consent given by: patient Required items: required blood products, implants, devices, and special equipment available Time out: Immediately prior to procedure a "time out" was called to verify the correct patient, procedure, equipment, support staff and site/side marked as required.  Location: Left eyebrow and inferior chin. Wound Appearance: clean  Sutures/Staples Removed: 7 sutures removed from eyebrow, 4 from chin. Patient tolerance: Patient tolerated the procedure well with no immediate complications.     MDM  Pt tolerated well.  PRN f/u.  Pt with no complaints.        Candis Musa, PA 10/02/11 1054  Medical screening examination/treatment/procedure(s) were conducted as a shared visit with non-physician practitioner(s) and myself.  I personally evaluated the patient during the encounter  Pt well appearing, no distress, no signs of infection He is  agreeable with plan  Joya Gaskins, MD 10/02/11 [phone]

## 2011-10-02 NOTE — ED Notes (Signed)
Pt presents to er for suture removal, Raynelle Fanning PA in room with pt

## 2011-10-02 NOTE — ED Notes (Signed)
Patient here for suture removal-7 above left eyebrow and 4 under chin. Per patient sutures placed 5-6 days ago.

## 2013-03-14 ENCOUNTER — Other Ambulatory Visit: Payer: Self-pay | Admitting: Internal Medicine

## 2013-03-14 ENCOUNTER — Encounter: Payer: Self-pay | Admitting: Gastroenterology

## 2013-03-14 ENCOUNTER — Ambulatory Visit (INDEPENDENT_AMBULATORY_CARE_PROVIDER_SITE_OTHER): Payer: Medicare Other | Admitting: Gastroenterology

## 2013-03-14 VITALS — BP 147/81 | HR 94 | Temp 97.6°F | Ht 69.0 in | Wt 145.8 lb

## 2013-03-14 DIAGNOSIS — R195 Other fecal abnormalities: Secondary | ICD-10-CM

## 2013-03-14 DIAGNOSIS — D649 Anemia, unspecified: Secondary | ICD-10-CM

## 2013-03-14 MED ORDER — PEG 3350-KCL-NA BICARB-NACL 420 G PO SOLR: 4000.0000 mL | ORAL | Status: DC

## 2013-03-14 NOTE — Progress Notes (Signed)
Primary Care Physician:  Milana Slagter, MD  Primary Gastroenterologist:  Roetta Sessions, MD   Chief Complaint  Patient presents with  . Colonoscopy    HPI:  [name] is a 72 y.o. male here at the request of Dr. Sudie Bailey for further evaluation of Hemoccult-positive stool, normocytic anemia. Patient reports earlier this year he lost some weight but he's gained almost all the back. He attributes it to poor diet when he was without electricity for one month. He denies heartburn, vomiting, abdominal pain, dysphagia, constipation, diarrhea, melena, rectal bleeding. Labs during routine physical showed hemoglobin 11.4, hematocrit 34, MCV 93.5. He was Hemoccult positive x3.  On Relafen for over 10 years. Last TCS by Dr. Elpidio Anis about 7 years ago was negative per patient. No family history of colon cancer or peptic ulcer disease.  Current Outpatient Prescriptions  Medication Sig Dispense Refill  . losartan-hydrochlorothiazide (HYZAAR) 100-25 MG per tablet Take 1 tablet by mouth daily.       . nabumetone (RELAFEN) 750 MG tablet Take 750 mg by mouth 2 (two) times daily.        No current facility-administered medications for this visit.    Allergies as of 03/14/2013  . (No Known Allergies)    Past Medical History  Diagnosis Date  . Hypertension     Past Surgical History  Procedure Laterality Date  . Leg surgery      left  . Colonoscopy  about 7-8 yrs ago    Family History  Problem Relation Age of Onset  . Stroke Mother   . Heart failure Father   . Colon cancer Neg Hx   . Liver disease Neg Hx   . Ulcers Neg Hx     History   Social History  . Marital Status: Divorced    Spouse Name: N/A    Number of Children: 12  . Years of Education: N/A   Occupational History  . Not on file.   Social History Main Topics  . Smoking status: Current Every Day Smoker    Types: Cigars  . Smokeless tobacco: Never Used  . Alcohol Use: 0.0 oz/week     Comment: occasionally,  patient denies history of regular alcohol use  . Drug Use: No  . Sexually Active: Yes    Birth Control/ Protection: Condom   Other Topics Concern  . Not on file   Social History Narrative  . No narrative on file      ROS:  General: Negative for anorexia, weight loss, fever, chills, fatigue, weakness. Eyes: Negative for vision changes.  ENT: Negative for hoarseness, difficulty swallowing , nasal congestion. CV: Negative for chest pain, angina, palpitations, dyspnea on exertion, peripheral edema.  Respiratory: Negative for dyspnea at rest, dyspnea on exertion, cough, sputum, wheezing.  GI: See history of present illness. GU:  Negative for dysuria, hematuria, urinary incontinence, urinary frequency, nocturnal urination.  MS: Negative for joint pain, low back pain.  Derm: Negative for rash or itching.  Neuro: Negative for weakness, abnormal sensation, seizure, frequent headaches, memory loss, confusion.  Psych: Negative for anxiety, depression, suicidal ideation, hallucinations.  Endo: Negative for unusual weight change.  Heme: Negative for bruising or bleeding. Allergy: Negative for rash or hives.    Physical Examination:  BP 147/81  Pulse 94  Temp(Src) 97.6 F (36.4 C) (Oral)  Ht 5\' 9"  (1.753 m)  Wt 145 lb 12.8 oz (66.134 kg)  BMI 21.52 kg/m2   General: Well-nourished, well-developed in no acute distress.  Head:  Normocephalic, atraumatic.   Eyes: Conjunctiva pink, no icterus. Mouth: Oropharyngeal mucosa moist and pink , no lesions erythema or exudate. Neck: Supple without thyromegaly, masses, or lymphadenopathy.  Lungs: Clear to auscultation bilaterally.  Heart: Regular rate and rhythm, no murmurs rubs or gallops.  Abdomen: Bowel sounds are normal, nontender, nondistended, no hepatosplenomegaly or masses, no abdominal bruits or    hernia , no rebound or guarding.   Rectal: defer Extremities: No lower extremity edema. No clubbing or deformities.  Neuro: Alert and  oriented x 4 , grossly normal neurologically.  Skin: Warm and dry, no rash or jaundice.   Psych: Alert and cooperative, normal mood and affect.  Labs: Labs from 01/17/2013 TSH 1.35, white blood cell count 6400, hemoglobin 11.4, hematocrit 34, MCV 93.5, platelets 212,000, total bilirubin 0.8, alkaline phosphatase 74, AST 34, ALT 25, albumin 4.0  Imaging Studies: No results found.

## 2013-03-14 NOTE — Progress Notes (Signed)
CC PCP 

## 2013-03-14 NOTE — Assessment & Plan Note (Signed)
72 year old gentleman who presents for further evaluation of normocytic anemia, Hemoccult-positive stool. He reports his last colonoscopy 6-7 years ago and unremarkable. No prior upper endoscopy. He is on chronic Relafen. Otherwise denies any GI symptoms. Given history of "alcohol abuse" on problem list from previous PCP, I inquired multiple times regarding his alcohol history. He denies having regular alcohol use in the present or the past. States he drink excessively a few times per year. Plan for colonoscopy plus or minus upper endoscopy for further evaluation of Hemoccult-positive stool in the setting of NSAIDs.  I have discussed the risks, alternatives, benefits with regards to but not limited to the risk of reaction to medication, bleeding, infection, perforation and the patient is agreeable to proceed. Written consent to be obtained.

## 2013-03-14 NOTE — Patient Instructions (Addendum)
1. We have scheduled you for colonoscopy with possible upper endoscopy with Dr. Jena Gauss. Please see separate instructions

## 2013-03-17 LAB — COMPREHENSIVE METABOLIC PANEL
AST: 34 U/L
Albumin: 4
Alkaline Phosphatase: 74 U/L
Total Bilirubin: 0.8 mg/dL

## 2013-03-22 ENCOUNTER — Encounter (HOSPITAL_COMMUNITY): Payer: Self-pay | Admitting: Pharmacy Technician

## 2013-03-27 ENCOUNTER — Encounter (HOSPITAL_COMMUNITY)
Admission: RE | Disposition: A | Discharge status: Home Health | Payer: Self-pay | Source: Ambulatory Visit | Attending: Internal Medicine

## 2013-03-27 ENCOUNTER — Ambulatory Visit (HOSPITAL_COMMUNITY)
Admission: RE | Admit: 2013-03-27 | Discharge: 2013-03-27 | Disposition: A | Discharge status: Home Health | Payer: Medicare Other | Source: Ambulatory Visit | Attending: Internal Medicine | Admitting: Internal Medicine

## 2013-03-27 ENCOUNTER — Encounter (HOSPITAL_COMMUNITY): Payer: Self-pay

## 2013-03-27 DIAGNOSIS — K2940 Chronic atrophic gastritis without bleeding: Secondary | ICD-10-CM | POA: Insufficient documentation

## 2013-03-27 DIAGNOSIS — K648 Other hemorrhoids: Secondary | ICD-10-CM | POA: Insufficient documentation

## 2013-03-27 DIAGNOSIS — A048 Other specified bacterial intestinal infections: Secondary | ICD-10-CM | POA: Insufficient documentation

## 2013-03-27 DIAGNOSIS — D175 Benign lipomatous neoplasm of intra-abdominal organs: Secondary | ICD-10-CM

## 2013-03-27 DIAGNOSIS — R195 Other fecal abnormalities: Secondary | ICD-10-CM

## 2013-03-27 DIAGNOSIS — D126 Benign neoplasm of colon, unspecified: Secondary | ICD-10-CM | POA: Insufficient documentation

## 2013-03-27 DIAGNOSIS — R933 Abnormal findings on diagnostic imaging of other parts of digestive tract: Secondary | ICD-10-CM

## 2013-03-27 DIAGNOSIS — I10 Essential (primary) hypertension: Secondary | ICD-10-CM | POA: Insufficient documentation

## 2013-03-27 DIAGNOSIS — D649 Anemia, unspecified: Secondary | ICD-10-CM | POA: Insufficient documentation

## 2013-03-27 DIAGNOSIS — K222 Esophageal obstruction: Secondary | ICD-10-CM | POA: Insufficient documentation

## 2013-03-27 HISTORY — PX: COLONOSCOPY: SHX5424

## 2013-03-27 HISTORY — PX: ESOPHAGOGASTRODUODENOSCOPY: SHX5428

## 2013-03-27 SURGERY — COLONOSCOPY
Anesthesia: Moderate Sedation

## 2013-03-27 MED ORDER — ONDANSETRON HCL 4 MG/2ML IJ SOLN
INTRAMUSCULAR | Status: AC
  Filled 2013-03-27: qty 2

## 2013-03-27 MED ORDER — MEPERIDINE HCL 100 MG/ML IJ SOLN
INTRAMUSCULAR | Status: AC
  Filled 2013-03-27: qty 2

## 2013-03-27 MED ORDER — STERILE WATER FOR IRRIGATION IR SOLN
Status: DC | PRN
  Administered 2013-03-27: 10:00:00

## 2013-03-27 MED ORDER — MIDAZOLAM HCL 5 MG/5ML IJ SOLN
INTRAMUSCULAR | Status: AC
  Filled 2013-03-27: qty 10

## 2013-03-27 MED ORDER — ONDANSETRON HCL 4 MG/2ML IJ SOLN
INTRAMUSCULAR | Status: DC | PRN
  Administered 2013-03-27: 4 mg via INTRAVENOUS

## 2013-03-27 MED ORDER — MIDAZOLAM HCL 5 MG/5ML IJ SOLN
INTRAMUSCULAR | Status: DC | PRN
  Administered 2013-03-27: 1 mg via INTRAVENOUS
  Administered 2013-03-27: 2 mg via INTRAVENOUS
  Administered 2013-03-27: 1 mg via INTRAVENOUS

## 2013-03-27 MED ORDER — SODIUM CHLORIDE 0.9 % IV SOLN
INTRAVENOUS | Status: DC
  Administered 2013-03-27: 10:00:00 via INTRAVENOUS

## 2013-03-27 MED ORDER — MEPERIDINE HCL 100 MG/ML IJ SOLN
INTRAMUSCULAR | Status: DC | PRN
  Administered 2013-03-27: 50 mg via INTRAVENOUS
  Administered 2013-03-27: 25 mg via INTRAVENOUS

## 2013-03-27 NOTE — Interval H&P Note (Signed)
History and Physical Interval Note:  03/27/2013 10:07 AM  [name]  has presented today for surgery, with the diagnosis of ANEMIA AND HEME POSITIVE STOOL  The various methods of treatment have been discussed with the patient and family. After consideration of risks, benefits and other options for treatment, the patient has consented to  Procedure(s) with comments: COLONOSCOPY (N/A) - 10;30 ESOPHAGOGASTRODUODENOSCOPY (EGD) (N/A) as a surgical intervention .  The patient's history has been reviewed, patient examined, no change in       status, stable for surgery.  I have reviewed the patient's chart and labs.  Questions were answered to the patient's satisfaction.     Steven Maduro [name]  Colonoscopy possible EGD to follow. No change.The risks, benefits, limitations, imponderables and alternatives regarding both EGD and colonoscopy have been reviewed with the patient. Questions have been answered. All parties agreeable.

## 2013-03-27 NOTE — H&P (View-Only) (Signed)
Primary Care Physician:  KNOWLTON,STEPHEN D, MD  Primary Gastroenterologist:  Michael Rourk, MD   Chief Complaint  Patient presents with  . Colonoscopy    HPI:  [name] is a 72 y.o. male here at the request of Dr. Knowlton for further evaluation of Hemoccult-positive stool, normocytic anemia. Patient reports earlier this year he lost some weight but he's gained almost all the back. He attributes it to poor diet when he was without electricity for one month. He denies heartburn, vomiting, abdominal pain, dysphagia, constipation, diarrhea, melena, rectal bleeding. Labs during routine physical showed hemoglobin 11.4, hematocrit 34, MCV 93.5. He was Hemoccult positive x3.  On Relafen for over 10 years. Last TCS by Dr. Leroy Smith about 7 years ago was negative per patient. No family history of colon cancer or peptic ulcer disease.  Current Outpatient Prescriptions  Medication Sig Dispense Refill  . losartan-hydrochlorothiazide (HYZAAR) 100-25 MG per tablet Take 1 tablet by mouth daily.       . nabumetone (RELAFEN) 750 MG tablet Take 750 mg by mouth 2 (two) times daily.        No current facility-administered medications for this visit.    Allergies as of 03/14/2013  . (No Known Allergies)    Past Medical History  Diagnosis Date  . Hypertension     Past Surgical History  Procedure Laterality Date  . Leg surgery      left  . Colonoscopy  about 7-8 yrs ago    Family History  Problem Relation Age of Onset  . Stroke Mother   . Heart failure Father   . Colon cancer Neg Hx   . Liver disease Neg Hx   . Ulcers Neg Hx     History   Social History  . Marital Status: Divorced    Spouse Name: N/A    Number of Children: 12  . Years of Education: N/A   Occupational History  . Not on file.   Social History Main Topics  . Smoking status: Current Every Day Smoker    Types: Cigars  . Smokeless tobacco: Never Used  . Alcohol Use: 0.0 oz/week     Comment: occasionally,  patient denies history of regular alcohol use  . Drug Use: No  . Sexually Active: Yes    Birth Control/ Protection: Condom   Other Topics Concern  . Not on file   Social History Narrative  . No narrative on file      ROS:  General: Negative for anorexia, weight loss, fever, chills, fatigue, weakness. Eyes: Negative for vision changes.  ENT: Negative for hoarseness, difficulty swallowing , nasal congestion. CV: Negative for chest pain, angina, palpitations, dyspnea on exertion, peripheral edema.  Respiratory: Negative for dyspnea at rest, dyspnea on exertion, cough, sputum, wheezing.  GI: See history of present illness. GU:  Negative for dysuria, hematuria, urinary incontinence, urinary frequency, nocturnal urination.  MS: Negative for joint pain, low back pain.  Derm: Negative for rash or itching.  Neuro: Negative for weakness, abnormal sensation, seizure, frequent headaches, memory loss, confusion.  Psych: Negative for anxiety, depression, suicidal ideation, hallucinations.  Endo: Negative for unusual weight change.  Heme: Negative for bruising or bleeding. Allergy: Negative for rash or hives.    Physical Examination:  BP 147/81  Pulse 94  Temp(Src) 97.6 F (36.4 C) (Oral)  Ht 5' 9" (1.753 m)  Wt 145 lb 12.8 oz (66.134 kg)  BMI 21.52 kg/m2   General: Well-nourished, well-developed in no acute distress.  Head:   Normocephalic, atraumatic.   Eyes: Conjunctiva pink, no icterus. Mouth: Oropharyngeal mucosa moist and pink , no lesions erythema or exudate. Neck: Supple without thyromegaly, masses, or lymphadenopathy.  Lungs: Clear to auscultation bilaterally.  Heart: Regular rate and rhythm, no murmurs rubs or gallops.  Abdomen: Bowel sounds are normal, nontender, nondistended, no hepatosplenomegaly or masses, no abdominal bruits or    hernia , no rebound or guarding.   Rectal: defer Extremities: No lower extremity edema. No clubbing or deformities.  Neuro: Alert and  oriented x 4 , grossly normal neurologically.  Skin: Warm and dry, no rash or jaundice.   Psych: Alert and cooperative, normal mood and affect.  Labs: Labs from 01/17/2013 TSH 1.35, white blood cell count 6400, hemoglobin 11.4, hematocrit 34, MCV 93.5, platelets 212,000, total bilirubin 0.8, alkaline phosphatase 74, AST 34, ALT 25, albumin 4.0  Imaging Studies: No results found.    

## 2013-03-27 NOTE — Op Note (Signed)
[facility] [address] [place], [address]   ENDOSCOPY PROCEDURE REPORT  PATIENT: [name], [name].  MR#: [id number] BIRTHDATE: November 25, 1940 , 72  yrs. old GENDER: Male ENDOSCOPIST:   R.  Roetta Sessions, MD FACP FACG REFERRED BY:  Gareth Morgan, M.D. PROCEDURE DATE:  03/27/2013 PROCEDURE:     EGD with gastric biopsy  INDICATIONS:     Anemia; Hemoccult positive stool  INFORMED CONSENT:   The risks, benefits, limitations, alternatives and imponderables have been discussed.  The potential for biopsy, esophogeal dilation, etc. have also been reviewed.  Questions have been answered.  All parties agreeable.  Please see the history and physical in the medical record for more information.  MEDICATIONS:    Versed 4 mg IV and Demerol 75 mg IV in divided doses. Zofran 4 mg IV. Cetacaine spray.  DESCRIPTION OF PROCEDURE:   The EG-[id number] ([id number])  endoscope was introduced through the mouth and advanced to the second portion of the duodenum without difficulty or limitations.  The mucosal surfaces were surveyed very carefully during advancement of the scope and upon withdrawal.  Retroflexion view of the proximal stomach and esophagogastric junction was performed.      FINDINGS: Normal esophagus aside from a noncritical Schatzki's ring. Stomach empty. Small hiatal hernia. Diffuse tiny erosions and submucosal petechial hemorrhage. No ulcer or infiltrating process. Patent pylorus. Normal first and second portion of the duodenum.  THERAPEUTIC / DIAGNOSTIC MANEUVERS PERFORMED:  Biopsies of the abnormal gastric mucosa taken for histologic study   COMPLICATIONS:  None  IMPRESSION:  Noncritical Schatzki's ring  -  not manipulated. Hiatal hernia. Abnormal gastric mucosa status post biopsy  RECOMMENDATIONS:  Followup on pathology. See colonoscopy report.    _______________________________ R. Roetta Sessions, MD FACP [facility] eSigned:  R. Roetta Sessions, MD FACP [facility] 03/27/2013  10:53 AM     CC:

## 2013-03-27 NOTE — Op Note (Signed)
[facility] [address] [place], [address]   COLONOSCOPY PROCEDURE REPORT  PATIENT: [name], [name].  MR#:         [id number] BIRTHDATE: [birth year] , 72  yrs. old GENDER: Male ENDOSCOPIST: R.  Roetta Sessions, MD FACP FACG REFERRED BY:  Gareth Morgan, M.D. PROCEDURE DATE:  03/27/2013 PROCEDURE:     Ileocolonoscopy with biopsy and snare polypectomy  INDICATIONS: anemia; Hemoccult positive stool  INFORMED CONSENT:  The risks, benefits, alternatives and imponderables including but not limited to bleeding, perforation as well as the possibility of a missed lesion have been reviewed.  The potential for biopsy, lesion removal, etc. have also been discussed.  Questions have been answered.  All parties agreeable. Please see the history and physical in the medical record for more information.  MEDICATIONS: Versed 3 mg IV and Demerol 75 mg IV in divided doses. Zofran 4 mg IV. Cetacaine spray.  DESCRIPTION OF PROCEDURE:  After a digital rectal exam was performed, the EC-[id number] ([id number])  colonoscope was advanced from the anus through the rectum and colon to the area of the cecum, ileocecal valve and appendiceal orifice.  The cecum was deeply intubated.  These structures were well-seen and photographed for the record.  From the level of the cecum and ileocecal valve, the scope was slowly and cautiously withdrawn.  The mucosal surfaces were carefully surveyed utilizing scope tip deflection to facilitate fold flattening as needed.  The scope was pulled down into the rectum where a thorough examination including retroflexion was performed.    FINDINGS:  Internal hemorrhoids and anal papilla; otherwise normal rectum. (1) 8mm angry pedunculated polyp in the mid sigmoid segment; (1) diminutive polyp in the mid ascending colon. The patient had a 1.5 cm submucosal bulge in the ascending segment (positive pillow sign)  consistent with a lipoma; otherwise, the remainder of  the colonic mucosa and distal 5 cm of terminal ileal mucosa appeared normal.  THERAPEUTIC / DIAGNOSTIC MANEUVERS PERFORMED:  The above-mentioned polyps were hot snare and cold biopsied/removed, respectively.  COMPLICATIONS: None  CECAL WITHDRAWAL TIME:  12 minutes  IMPRESSION:  Colonic polyps-removed as described above. Internal hemorrhoids. Colonic Lipoma  RECOMMENDATIONS:  Final pathology. See EGD Report.   _______________________________ eSigned:  R. Roetta Sessions, MD FACP [facility] 03/27/2013 10:42 AM   CC:    PATIENT NAME:  [name], [name]. MR#: [id number]

## 2013-03-29 ENCOUNTER — Encounter (HOSPITAL_COMMUNITY): Payer: Self-pay | Admitting: Internal Medicine

## 2013-03-30 ENCOUNTER — Encounter: Payer: Self-pay | Admitting: Internal Medicine

## 2013-03-31 ENCOUNTER — Telehealth: Payer: Self-pay

## 2013-03-31 NOTE — Telephone Encounter (Signed)
Letter from: Corbin Ade   Reason for Letter: Results Review   Send letter to patient.  Send copy of letter with path to referring provider and PCP.   Raynelle Fanning, patient needs Prevpac or generic equivalent x14 days. Please call in prescription. No refills.

## 2013-04-05 NOTE — Telephone Encounter (Signed)
Tried to call pt- LMOM 

## 2013-04-06 MED ORDER — AMOXICILL-CLARITHRO-LANSOPRAZ PO MISC: Freq: Two times a day (BID) | ORAL | Status: DC

## 2013-04-06 NOTE — Telephone Encounter (Signed)
Pt is aware. rx has been sent to the pharmacy. 

## 2013-04-06 NOTE — Telephone Encounter (Signed)
Cc PCP 

## 2014-09-25 ENCOUNTER — Other Ambulatory Visit (HOSPITAL_COMMUNITY): Payer: Self-pay | Admitting: Family Medicine

## 2014-09-25 ENCOUNTER — Ambulatory Visit (HOSPITAL_COMMUNITY)
Admission: RE | Admit: 2014-09-25 | Discharge: 2014-09-25 | Disposition: A | Discharge status: Home / Self Care | Payer: Medicare Other | Source: Ambulatory Visit | Attending: Family Medicine | Admitting: Family Medicine

## 2014-09-25 DIAGNOSIS — M5136 Other intervertebral disc degeneration, lumbar region: Secondary | ICD-10-CM

## 2014-09-25 DIAGNOSIS — M549 Dorsalgia, unspecified: Secondary | ICD-10-CM | POA: Diagnosis present

## 2015-04-16 ENCOUNTER — Ambulatory Visit (INDEPENDENT_AMBULATORY_CARE_PROVIDER_SITE_OTHER): Payer: Medicare Other | Admitting: Urology

## 2015-04-16 DIAGNOSIS — N402 Nodular prostate without lower urinary tract symptoms: Secondary | ICD-10-CM | POA: Diagnosis not present

## 2018-02-28 ENCOUNTER — Encounter: Payer: Self-pay | Admitting: Internal Medicine

## 2018-04-11 ENCOUNTER — Ambulatory Visit: Payer: Medicare Other

## 2018-04-11 ENCOUNTER — Telehealth: Payer: Self-pay

## 2018-04-11 NOTE — Telephone Encounter (Signed)
noted 

## 2018-04-11 NOTE — Telephone Encounter (Signed)
PATIENT WAS A NO SHOW AND LETTER SENT  °

## 2018-05-25 ENCOUNTER — Ambulatory Visit (INDEPENDENT_AMBULATORY_CARE_PROVIDER_SITE_OTHER): Payer: Self-pay

## 2018-05-25 DIAGNOSIS — Z86010 Personal history of colonic polyps: Secondary | ICD-10-CM

## 2018-05-25 NOTE — Progress Notes (Signed)
Pt came in the office today for a nurse visit for a 5 year recall. Pt is currently 79. I explained the current guidelines for screening colonoscopies and he stated he is doing fine and not having any problems with his bowels and he does not want to have any more colonoscopies done unless he has to. He will contact us if he has any problems.

## 2018-05-27 NOTE — Progress Notes (Signed)
Noted, no further recommendations at this time. 

## 2018-12-30 ENCOUNTER — Emergency Department (HOSPITAL_COMMUNITY)
Admission: EM | Admit: 2018-12-30 | Discharge: 2018-12-31 | Disposition: A | Discharge status: Home / Self Care | Payer: Medicare Other | Attending: Emergency Medicine | Admitting: Emergency Medicine

## 2018-12-30 ENCOUNTER — Encounter (HOSPITAL_COMMUNITY): Payer: Self-pay | Admitting: Emergency Medicine

## 2018-12-30 ENCOUNTER — Other Ambulatory Visit: Payer: Self-pay

## 2018-12-30 DIAGNOSIS — F17290 Nicotine dependence, other tobacco product, uncomplicated: Secondary | ICD-10-CM | POA: Insufficient documentation

## 2018-12-30 DIAGNOSIS — K209 Esophagitis, unspecified without bleeding: Secondary | ICD-10-CM

## 2018-12-30 DIAGNOSIS — I10 Essential (primary) hypertension: Secondary | ICD-10-CM | POA: Insufficient documentation

## 2018-12-30 DIAGNOSIS — R112 Nausea with vomiting, unspecified: Secondary | ICD-10-CM | POA: Diagnosis not present

## 2018-12-30 DIAGNOSIS — R109 Unspecified abdominal pain: Secondary | ICD-10-CM | POA: Diagnosis present

## 2018-12-30 DIAGNOSIS — Z79899 Other long term (current) drug therapy: Secondary | ICD-10-CM | POA: Insufficient documentation

## 2018-12-30 LAB — CBC WITH DIFFERENTIAL/PLATELET
Abs Immature Granulocytes: 0.01 10*3/uL (ref 0.00–0.07)
Basophils Absolute: 0 10*3/uL (ref 0.0–0.1)
Basophils Relative: 0 %
Eosinophils Absolute: 0 10*3/uL (ref 0.0–0.5)
Eosinophils Relative: 0 %
HCT: 47.8 % (ref 39.0–52.0)
Hemoglobin: 15.6 g/dL (ref 13.0–17.0)
Immature Granulocytes: 0 %
Lymphocytes Relative: 21 %
Lymphs Abs: 1.4 10*3/uL (ref 0.7–4.0)
MCH: 29.5 pg (ref 26.0–34.0)
MCHC: 32.6 g/dL (ref 30.0–36.0)
MCV: 90.4 fL (ref 80.0–100.0)
Monocytes Absolute: 0.2 10*3/uL (ref 0.1–1.0)
Monocytes Relative: 4 %
Neutro Abs: 4.9 10*3/uL (ref 1.7–7.7)
Neutrophils Relative %: 75 %
Platelets: 191 10*3/uL (ref 150–400)
RBC: 5.29 MIL/uL (ref 4.22–5.81)
RDW: 15.1 % (ref 11.5–15.5)
WBC: 6.3 10*3/uL (ref 4.0–10.5)
nRBC: 0 % (ref 0.0–0.2)

## 2018-12-30 LAB — CBG MONITORING, ED: Glucose-Capillary: 118 mg/dL — ABNORMAL HIGH (ref 70–99)

## 2018-12-30 MED ORDER — SODIUM CHLORIDE 0.9 % IV SOLN
1000.0000 mL | INTRAVENOUS | Status: DC
  Administered 2018-12-31: 1000 mL via INTRAVENOUS

## 2018-12-30 MED ORDER — ONDANSETRON HCL 4 MG PO TABS
4.0000 mg | ORAL_TABLET | Freq: Once | ORAL | Status: AC
  Administered 2018-12-30: 4 mg via ORAL
  Filled 2018-12-30: qty 1

## 2018-12-30 MED ORDER — SODIUM CHLORIDE 0.9 % IV BOLUS (SEPSIS)
1000.0000 mL | Freq: Once | INTRAVENOUS | Status: AC
  Administered 2018-12-30: 1000 mL via INTRAVENOUS

## 2018-12-30 NOTE — ED Notes (Signed)
Informed pt we need urine sample 

## 2018-12-30 NOTE — ED Triage Notes (Signed)
ED via EMS from home c/o of N/V/D and abd pain x3 days.

## 2018-12-30 NOTE — ED Notes (Signed)
Pt given urinal.

## 2018-12-30 NOTE — ED Provider Notes (Signed)
[facility] EMERGENCY DEPARTMENT Provider Note   CSN: [id number] Arrival date & time: 12/30/18  2202    History   Chief Complaint Chief Complaint  Patient presents with  . Abdominal Pain  . Emesis  . Nausea    HPI [name] is a 78 y.o. male.     Patient is a 78 year old male who presents to the emergency department by EMS because of nausea, vomiting, and diarrhea.  The patient states that he has been having abdominal pain since May 5.  This is been accompanied by nausea, vomiting, and diarrhea.  The patient is mostly in the bellybutton area.  It is also accompanied by decreased appetite.  There is been no fever or chills reported.  No diarrhea, no constipation.  No recent changes in diet or medication.  The patient denies excessive use of aspirin related products.  He is not had any problems with dysuria.  He is not had any blood in his stool, or problems with his stool.  There is been no blood in the vomitus.  He presents now for assistance with this issue.  The history is provided by the patient.  Abdominal Pain  Pain location:  Periumbilical Associated symptoms: nausea and vomiting   Associated symptoms: no chest pain, no chills, no cough, no dysuria, no fever, no hematuria and no shortness of breath   Emesis  Associated symptoms: abdominal pain   Associated symptoms: no arthralgias, no chills, no cough and no fever     Past Medical History:  Diagnosis Date  . Hypertension     Patient Active Problem List   Diagnosis Date Noted  . Normocytic anemia 03/14/2013  . Heme positive stool 03/14/2013  . BACK PAIN, LUMBAR 04/17/2009  . HERNIA, UMBILICAL 92/42/6834  . MIXED HYPERLIPIDEMIA 08/17/2007  . ALCOHOL ABUSE 05/17/2007  . HYPERTENSION 05/17/2007  . TOBACCO ABUSE 05/02/2007  . PROTEINURIA 05/02/2007  . ELECTROCARDIOGRAM, ABNORMAL 05/02/2007    Past Surgical History:  Procedure Laterality Date  . COLONOSCOPY  about 7-8 yrs ago  . COLONOSCOPY N/A 03/27/2013    Procedure: COLONOSCOPY;  Surgeon: Daneil Dolin, MD;  Location: [facility] ENDO SUITE;  Service: Endoscopy;  Laterality: N/A;  10;30  . ESOPHAGOGASTRODUODENOSCOPY N/A 03/27/2013   Procedure: ESOPHAGOGASTRODUODENOSCOPY (EGD);  Surgeon: Daneil Dolin, MD;  Location: [facility] ENDO SUITE;  Service: Endoscopy;  Laterality: N/A;  . LEG SURGERY     left        Home Medications    Prior to Admission medications   Medication Sig Start Date End Date Taking? Authorizing Provider  amoxicillin-clarithromycin-lansoprazole [facility]) combo pack Take by mouth 2 (two) times daily. Follow package directions. 04/06/13   Rourk, Cristopher Estimable, MD  losartan-hydrochlorothiazide (HYZAAR) 100-25 MG per tablet Take 1 tablet by mouth daily.  01/17/13   [provider]  nabumetone (RELAFEN) 750 MG tablet Take 750 mg by mouth 2 (two) times daily.  01/20/13   [provider]  polyethylene glycol-electrolytes (TRILYTE) 420 G solution Take 4,000 mLs by mouth as directed. 03/14/13   Rourk, Cristopher Estimable, MD    Family History Family History  Problem Relation Age of Onset  . Stroke Mother   . Heart failure Father   . Colon cancer Neg Hx   . Liver disease Neg Hx   . Ulcers Neg Hx     Social History Social History   Tobacco Use  . Smoking status: Current Every Day Smoker    Types: Cigars  . Smokeless tobacco: Never Used  Substance Use Topics  . Alcohol use: Yes    Comment: occasionally, patient denies history of regular alcohol use  . Drug use: No     Allergies   Patient has no known allergies.   Review of Systems Review of Systems  Constitutional: Positive for appetite change. Negative for activity change, chills and fever.       All ROS Neg except as noted in HPI  HENT: Negative for nosebleeds.   Eyes: Negative for photophobia and discharge.  Respiratory: Negative for cough, shortness of breath and wheezing.   Cardiovascular: Negative for chest pain and palpitations.  Gastrointestinal: Positive for  abdominal pain, nausea and vomiting. Negative for blood in stool.  Genitourinary: Negative for dysuria, frequency and hematuria.  Musculoskeletal: Negative for arthralgias, back pain and neck pain.  Skin: Negative.   Neurological: Negative for dizziness, seizures and speech difficulty.  Psychiatric/Behavioral: Negative for confusion and hallucinations.     Physical Exam Updated Vital Signs BP (!) 171/110 (BP Location: Left Arm)   Pulse (!) 120   Temp 99 F (37.2 C) (Oral)   Resp 18   Ht 5\' 10"  (1.778 m)   Wt 70.3 kg   SpO2 97%   BMI 22.24 kg/m   Physical Exam Vitals signs and nursing note reviewed.  Constitutional:      Appearance: He is well-developed. He is not toxic-appearing.  HENT:     Head: Normocephalic.     Comments: Oral mucous membranes are somewhat dry.    Right Ear: Tympanic membrane and external ear normal.     Left Ear: Tympanic membrane and external ear normal.  Eyes:     General: Lids are normal.     Pupils: Pupils are equal, round, and reactive to light.  Neck:     Musculoskeletal: Normal range of motion and neck supple.     Vascular: No carotid bruit.  Cardiovascular:     Rate and Rhythm: Regular rhythm. Tachycardia present.     Pulses: Normal pulses.     Heart sounds: Normal heart sounds.  Pulmonary:     Effort: No respiratory distress.     Breath sounds: Normal breath sounds.  Abdominal:     General: Bowel sounds are normal.     Palpations: Abdomen is soft.     Tenderness: There is abdominal tenderness in the periumbilical area. There is no guarding.     Comments: Mild to moderate periumbilical tenderness.  Abdomen is nondistended.  No hepatomegaly or splenomegaly appreciated.  Musculoskeletal: Normal range of motion.  Lymphadenopathy:     Head:     Right side of head: No submandibular adenopathy.     Left side of head: No submandibular adenopathy.     Cervical: No cervical adenopathy.  Skin:    General: Skin is warm and dry.  Neurological:      Mental Status: He is alert and oriented to person, place, and time.     Cranial Nerves: No cranial nerve deficit.     Sensory: No sensory deficit.  Psychiatric:        Speech: Speech normal.      ED Treatments / Results  Labs (all labs ordered are listed, but only abnormal results are displayed) Labs Reviewed  CBC WITH DIFFERENTIAL/PLATELET  COMPREHENSIVE METABOLIC PANEL  LIPASE, BLOOD  URINALYSIS, ROUTINE W REFLEX MICROSCOPIC  CBG MONITORING, ED    EKG None  Radiology No results found.  Procedures Procedures (including critical care time)  Medications Ordered in ED Medications  ondansetron (  ZOFRAN) tablet 4 mg (has no administration in time range)  sodium chloride 0.9 % bolus 1,000 mL (has no administration in time range)    Followed by  0.9 %  sodium chloride infusion (has no administration in time range)     Initial Impression / Assessment and Plan / ED Course  I have reviewed the triage vital signs and the nursing notes.  Pertinent labs & imaging results that were available during my care of the patient were reviewed by me and considered in my medical decision making (see chart for details).          Final Clinical Impressions(s) / ED Diagnoses MDM  Temperature is 99.  Pulse rate elevated at 120.  Blood pressure is elevated at 171/110.  The pulse oximetry is within normal limits at 97% on room air.  Mucous membranes of the mouth appear to be somewhat dry.  There is question of a fruity smell on the breath.  IV fluids started.  Will obtain a CBG.  Will evaluate labs and urine.  Recheck.  Patient states he feels some better after fluids and Zofran.  I have updated the patient on his complete blood count and lipase.  CT scan pending. Pt care to be continued with Dr. Roderic Palau.      Final diagnoses:  Esophagitis    ED Discharge Orders         Ordered    pantoprazole (PROTONIX) 20 MG tablet  Daily     12/31/18 0301    ondansetron (ZOFRAN ODT) 4  MG disintegrating tablet     12/31/18 0301           Lily Kocher, PA-C 12/31/18 [id number]    Milton Ferguson, MD 01/01/19 [phone]

## 2018-12-31 ENCOUNTER — Emergency Department (HOSPITAL_COMMUNITY): Payer: Medicare Other

## 2018-12-31 DIAGNOSIS — K209 Esophagitis, unspecified: Secondary | ICD-10-CM | POA: Diagnosis not present

## 2018-12-31 LAB — URINALYSIS, ROUTINE W REFLEX MICROSCOPIC
Bilirubin Urine: NEGATIVE
Glucose, UA: NEGATIVE mg/dL
Ketones, ur: 80 mg/dL — AB
Leukocytes,Ua: NEGATIVE
Nitrite: NEGATIVE
Protein, ur: 100 mg/dL — AB
Specific Gravity, Urine: 1.026 (ref 1.005–1.030)
pH: 6 (ref 5.0–8.0)

## 2018-12-31 LAB — COMPREHENSIVE METABOLIC PANEL
ALT: 39 U/L (ref 0–44)
AST: 55 U/L — ABNORMAL HIGH (ref 15–41)
Albumin: 5.4 g/dL — ABNORMAL HIGH (ref 3.5–5.0)
Alkaline Phosphatase: 127 U/L — ABNORMAL HIGH (ref 38–126)
Anion gap: 26 — ABNORMAL HIGH (ref 5–15)
BUN: 18 mg/dL (ref 8–23)
CO2: 18 mmol/L — ABNORMAL LOW (ref 22–32)
Calcium: 9.9 mg/dL (ref 8.9–10.3)
Chloride: 90 mmol/L — ABNORMAL LOW (ref 98–111)
Creatinine, Ser: 1.59 mg/dL — ABNORMAL HIGH (ref 0.61–1.24)
GFR calc Af Amer: 48 mL/min — ABNORMAL LOW (ref >60)
GFR calc non Af Amer: 41 mL/min — ABNORMAL LOW (ref >60)
Glucose, Bld: 113 mg/dL — ABNORMAL HIGH (ref 70–99)
Potassium: 4.4 mmol/L (ref 3.5–5.1)
Sodium: 134 mmol/L — ABNORMAL LOW (ref 135–145)
Total Bilirubin: 1.1 mg/dL (ref 0.3–1.2)
Total Protein: 10.7 g/dL — ABNORMAL HIGH (ref 6.5–8.1)

## 2018-12-31 LAB — LIPASE, BLOOD: Lipase: 24 U/L (ref 11–51)

## 2018-12-31 MED ORDER — PANTOPRAZOLE SODIUM 40 MG PO TBEC
40.0000 mg | DELAYED_RELEASE_TABLET | Freq: Once | ORAL | Status: AC
  Administered 2018-12-31: 40 mg via ORAL
  Filled 2018-12-31: qty 1

## 2018-12-31 MED ORDER — PANTOPRAZOLE SODIUM 20 MG PO TBEC: 20.0000 mg | DELAYED_RELEASE_TABLET | Freq: Every day | ORAL | 0 refills | Status: DC

## 2018-12-31 MED ORDER — IOHEXOL 300 MG/ML  SOLN
100.0000 mL | Freq: Once | INTRAMUSCULAR | Status: AC | PRN
  Administered 2018-12-31: 01:00:00 100 mL via INTRAVENOUS

## 2018-12-31 MED ORDER — ONDANSETRON 4 MG PO TBDP: ORAL_TABLET | ORAL | 0 refills | Status: DC

## 2018-12-31 NOTE — Discharge Instructions (Addendum)
Drink plenty of fluids.  Follow up with your md next week.  Return this weekend if not improving

## 2020-10-24 ENCOUNTER — Emergency Department (HOSPITAL_COMMUNITY): Payer: Medicare Other

## 2020-10-24 ENCOUNTER — Other Ambulatory Visit: Payer: Self-pay

## 2020-10-24 ENCOUNTER — Emergency Department (HOSPITAL_COMMUNITY)
Admission: EM | Admit: 2020-10-24 | Discharge: 2020-10-25 | Disposition: A | Discharge status: Home / Self Care | Payer: Medicare Other | Attending: Emergency Medicine | Admitting: Emergency Medicine

## 2020-10-24 ENCOUNTER — Encounter (HOSPITAL_COMMUNITY): Payer: Self-pay

## 2020-10-24 DIAGNOSIS — Z79899 Other long term (current) drug therapy: Secondary | ICD-10-CM | POA: Insufficient documentation

## 2020-10-24 DIAGNOSIS — Z743 Need for continuous supervision: Secondary | ICD-10-CM | POA: Diagnosis not present

## 2020-10-24 DIAGNOSIS — S12491A Other nondisplaced fracture of fifth cervical vertebra, initial encounter for closed fracture: Secondary | ICD-10-CM | POA: Diagnosis not present

## 2020-10-24 DIAGNOSIS — F10920 Alcohol use, unspecified with intoxication, uncomplicated: Secondary | ICD-10-CM

## 2020-10-24 DIAGNOSIS — M79631 Pain in right forearm: Secondary | ICD-10-CM | POA: Diagnosis not present

## 2020-10-24 DIAGNOSIS — F10220 Alcohol dependence with intoxication, uncomplicated: Secondary | ICD-10-CM | POA: Diagnosis not present

## 2020-10-24 DIAGNOSIS — R410 Disorientation, unspecified: Secondary | ICD-10-CM | POA: Diagnosis not present

## 2020-10-24 DIAGNOSIS — R404 Transient alteration of awareness: Secondary | ICD-10-CM | POA: Diagnosis not present

## 2020-10-24 DIAGNOSIS — Y93H2 Activity, gardening and landscaping: Secondary | ICD-10-CM | POA: Diagnosis not present

## 2020-10-24 DIAGNOSIS — S0993XA Unspecified injury of face, initial encounter: Secondary | ICD-10-CM | POA: Diagnosis not present

## 2020-10-24 DIAGNOSIS — W19XXXA Unspecified fall, initial encounter: Secondary | ICD-10-CM | POA: Diagnosis not present

## 2020-10-24 DIAGNOSIS — S0031XA Abrasion of nose, initial encounter: Secondary | ICD-10-CM | POA: Insufficient documentation

## 2020-10-24 DIAGNOSIS — I10 Essential (primary) hypertension: Secondary | ICD-10-CM | POA: Insufficient documentation

## 2020-10-24 DIAGNOSIS — F17290 Nicotine dependence, other tobacco product, uncomplicated: Secondary | ICD-10-CM | POA: Diagnosis not present

## 2020-10-24 DIAGNOSIS — S12401A Unspecified nondisplaced fracture of fifth cervical vertebra, initial encounter for closed fracture: Secondary | ICD-10-CM | POA: Insufficient documentation

## 2020-10-24 DIAGNOSIS — S00511A Abrasion of lip, initial encounter: Secondary | ICD-10-CM | POA: Insufficient documentation

## 2020-10-24 DIAGNOSIS — S199XXA Unspecified injury of neck, initial encounter: Secondary | ICD-10-CM | POA: Diagnosis present

## 2020-10-24 DIAGNOSIS — I6381 Other cerebral infarction due to occlusion or stenosis of small artery: Secondary | ICD-10-CM | POA: Diagnosis not present

## 2020-10-24 DIAGNOSIS — M79601 Pain in right arm: Secondary | ICD-10-CM | POA: Diagnosis not present

## 2020-10-24 LAB — BASIC METABOLIC PANEL
Anion gap: 14 (ref 5–15)
BUN: 12 mg/dL (ref 8–23)
CO2: 22 mmol/L (ref 22–32)
Calcium: 8.6 mg/dL — ABNORMAL LOW (ref 8.9–10.3)
Chloride: 103 mmol/L (ref 98–111)
Creatinine, Ser: 0.96 mg/dL (ref 0.61–1.24)
GFR, Estimated: >60 mL/min (ref >60)
Glucose, Bld: 93 mg/dL (ref 70–99)
Potassium: 3.3 mmol/L — ABNORMAL LOW (ref 3.5–5.1)
Sodium: 139 mmol/L (ref 135–145)

## 2020-10-24 LAB — CBC
HCT: 39.2 % (ref 39.0–52.0)
Hemoglobin: 12.5 g/dL — ABNORMAL LOW (ref 13.0–17.0)
MCH: 31.3 pg (ref 26.0–34.0)
MCHC: 31.9 g/dL (ref 30.0–36.0)
MCV: 98.2 fL (ref 80.0–100.0)
Platelets: 326 10*3/uL (ref 150–400)
RBC: 3.99 MIL/uL — ABNORMAL LOW (ref 4.22–5.81)
RDW: 16.8 % — ABNORMAL HIGH (ref 11.5–15.5)
WBC: 8.2 10*3/uL (ref 4.0–10.5)
nRBC: 0 % (ref 0.0–0.2)

## 2020-10-24 LAB — ETHANOL: Alcohol, Ethyl (B): 149 mg/dL — ABNORMAL HIGH (ref <10)

## 2020-10-24 NOTE — ED Notes (Signed)
Patient is asleep and resting.  Did not disturb patient

## 2020-10-24 NOTE — ED Provider Notes (Signed)
[facility] EMERGENCY DEPARTMENT Provider Note   CSN: [id number] Arrival date & time: 10/24/20  1155     History Chief Complaint  Patient presents with  . Alcohol Intoxication    [name] is a 80 y.o. male with a history of HTN and etoh abuse presenting with acute intoxication.  Pt was found lying in his front yard and EMS called for transport.  He is clearly very intoxicated with slurred speech, reports hit his face when he fell but denies facial or head pain.  He does endorse pain in his right forearm.  He states he drinks daily, unable to quantify amounts.  Pt is difficult to understand secondary to slurred speech.    Level 5 caveat secondary to intoxication.  HPI     Past Medical History:  Diagnosis Date  . Hypertension     Patient Active Problem List   Diagnosis Date Noted  . Normocytic anemia 03/14/2013  . Heme positive stool 03/14/2013  . BACK PAIN, LUMBAR 04/17/2009  . HERNIA, UMBILICAL 32/20/2542  . MIXED HYPERLIPIDEMIA 08/17/2007  . ALCOHOL ABUSE 05/17/2007  . HYPERTENSION 05/17/2007  . TOBACCO ABUSE 05/02/2007  . PROTEINURIA 05/02/2007  . ELECTROCARDIOGRAM, ABNORMAL 05/02/2007    Past Surgical History:  Procedure Laterality Date  . COLONOSCOPY  about 7-8 yrs ago  . COLONOSCOPY N/A 03/27/2013   Procedure: COLONOSCOPY;  Surgeon: Daneil Dolin, MD;  Location: [facility]ITE;  Service: Endoscopy;  Laterality: N/A;  10;30  . ESOPHAGOGASTRODUODENOSCOPY N/A 03/27/2013   Procedure: ESOPHAGOGASTRODUODENOSCOPY (EGD);  Surgeon: Daneil Dolin, MD;  Location: [facility];  Service: Endoscopy;  Laterality: N/A;  . LEG SURGERY     left       Family History  Problem Relation Age of Onset  . Stroke Mother   . Heart failure Father   . Colon cancer Neg Hx   . Liver disease Neg Hx   . Ulcers Neg Hx     Social History   Tobacco Use  . Smoking status: Current Every Day Smoker    Types: Cigars  . Smokeless tobacco: Never Used  Substance Use Topics  .  Alcohol use: Yes    Comment: occasionally, patient denies history of regular alcohol use  . Drug use: No    Home Medications Prior to Admission medications   Medication Sig Start Date End Date Taking? Authorizing Provider  amoxicillin-clarithromycin-lansoprazole [facility]) combo pack Take by mouth 2 (two) times daily. Follow package directions. Patient not taking: No sig reported 04/06/13   Rourk, Cristopher Estimable, MD  losartan-hydrochlorothiazide (HYZAAR) 100-25 MG per tablet Take 1 tablet by mouth daily.  Patient not taking: Reported on 10/24/2020 01/17/13   [provider]  nabumetone (RELAFEN) 750 MG tablet Take 750 mg by mouth 2 (two) times daily.  Patient not taking: Reported on 10/24/2020 01/20/13   [provider]  ondansetron (ZOFRAN ODT) 4 MG disintegrating tablet 4mg  ODT q4 hours prn nausea/vomit Patient not taking: No sig reported 12/31/18   Milton Ferguson, MD  pantoprazole (PROTONIX) 20 MG tablet Take 1 tablet (20 mg total) by mouth daily. Patient not taking: No sig reported 12/31/18   Milton Ferguson, MD  polyethylene glycol-electrolytes (TRILYTE) 420 G solution Take 4,000 mLs by mouth as directed. Patient not taking: Reported on 10/24/2020 03/14/13   Rourk, Cristopher Estimable, MD    Allergies    Patient has no known allergies.  Review of Systems   Review of Systems  Unable to perform ROS: Other (intoxicated)  Musculoskeletal: Positive for arthralgias.    Physical Exam Updated Vital Signs BP (!) 176/84   Pulse 90   Temp 97.6 F (36.4 C) (Oral)   Resp 17   Ht 5\' 10"  (1.778 m)   Wt 79.4 kg   SpO2 98%   BMI 25.11 kg/m   Physical Exam Vitals and nursing note reviewed.  Constitutional:      General: He is not in acute distress.    Appearance: He is well-developed and well-nourished.     Comments: Smells of etoh  HENT:     Head:     Comments: Nasal and lip abrasions noted.  Dried blood in right nare, no active bleeding.    Mouth/Throat:     Mouth: Mucous membranes are  moist.  Eyes:     Extraocular Movements: Extraocular movements intact.     Conjunctiva/sclera: Conjunctivae normal.  Cardiovascular:     Rate and Rhythm: Normal rate and regular rhythm.     Pulses: Intact distal pulses.     Heart sounds: Normal heart sounds.  Pulmonary:     Effort: Pulmonary effort is normal.     Breath sounds: Normal breath sounds. No wheezing.  Abdominal:     General: Bowel sounds are normal. There is no distension.     Palpations: Abdomen is soft.     Tenderness: There is no abdominal tenderness.  Musculoskeletal:        General: Tenderness present. No swelling or deformity. Normal range of motion.     Right forearm: Tenderness present.     Cervical back: Tenderness present.     Comments: Tender to palpation right mid forearm, there is no edema or deformity.  Skin:    General: Skin is warm and dry.  Neurological:     Mental Status: He is alert. He is confused.     Cranial Nerves: Cranial nerves are intact.     Sensory: Sensation is intact.     Motor: Motor function is intact.     Comments: Limited neuro exam secondary to intoxication and inability to cooperate.  Psychiatric:        Mood and Affect: Affect is labile.     ED Results / Procedures / Treatments   Labs (all labs ordered are listed, but only abnormal results are displayed) Labs Reviewed  ETHANOL - Abnormal; Notable for the following components:      Result Value   Alcohol, Ethyl (B) 149 (*)    All other components within normal limits  BASIC METABOLIC PANEL - Abnormal; Notable for the following components:   Potassium 3.3 (*)    Calcium 8.6 (*)    All other components within normal limits  CBC - Abnormal; Notable for the following components:   RBC 3.99 (*)    Hemoglobin 12.5 (*)    RDW 16.8 (*)    All other components within normal limits    EKG None  Radiology DG Forearm Right  Result Date: 10/24/2020 CLINICAL DATA:  Right arm pain following a fall.  Intoxicated. EXAM: RIGHT  FOREARM - 2 VIEW COMPARISON:  None. FINDINGS: There is no evidence of fracture or other focal bone lesions. Soft tissues are unremarkable. IMPRESSION: Normal examination. Electronically Signed   By: Claudie Revering M.D.   On: 10/24/2020 14:01   CT Head Wo Contrast  Result Date: 10/24/2020 CLINICAL DATA:  Head trauma, minor. Neck trauma, intoxicated or up tended. Facial trauma. Additional history provided: Patient found down, unsure of injury or fall, patient reports  pain all over, laceration with blood to left nostril EXAM: CT HEAD WITHOUT CONTRAST CT MAXILLOFACIAL WITHOUT CONTRAST CT CERVICAL SPINE WITHOUT CONTRAST TECHNIQUE: Multidetector CT imaging of the head, cervical spine, and maxillofacial structures were performed using the standard protocol without intravenous contrast. Multiplanar CT image reconstructions of the cervical spine and maxillofacial structures were also generated. COMPARISON:  Head CT 09/26/2011.  CT cervical spine 09/26/2011. FINDINGS: CT HEAD FINDINGS Brain: Moderate cerebral atrophy. Comparatively mild cerebellar atrophy. Redemonstrated chronic lacunar infarcts within the left corona radiata/basal ganglia. Background mild ill-defined hypoattenuation within the cerebral white matter is nonspecific, but compatible with chronic small vessel ischemic disease. There is no acute intracranial hemorrhage. No demarcated cortical infarct. No extra-axial fluid collection. No evidence of intracranial mass. No midline shift. Vascular: No hyperdense vessel.  Atherosclerotic calcifications. Skull: Normal. Negative for fracture or focal lesion. CT MAXILLOFACIAL FINDINGS Osseous: No acute maxillofacial fracture is identified. Orbits: No acute abnormality within the orbits. The globes are normal in size and contour. The extraocular muscles and optic nerve sheath complexes are symmetric and unremarkable. Sinuses: Mild mucosal thickening within the right frontal sinus. Moderate mucosal thickening within the  bilateral ethmoid air cells. Mild mucosal thickening within the right sphenoid sinus. Mild mucosal thickening within the right maxillary sinus. Trace mucosal thickening and small mucous retention cyst within the left maxillary sinus. Soft tissues: Punctate hyperdense focus within the right periorbital soft tissues (series 7, image 31). No definite maxillofacial soft tissue swelling or hematoma appreciable by CT. CT CERVICAL SPINE FINDINGS Alignment: No significant spondylolisthesis. Skull base and vertebrae: The basion-dental and atlanto-dental intervals are maintained.Subtle nondisplaced fracture of the C5 spinous process (series 21, image 51) (series 19, image 27). No other acute cervical spine fracture is identified. Soft tissues and spinal canal: No prevertebral fluid or swelling. No visible canal hematoma. Disc levels: Cervical spondylosis with multilevel disc space narrowing, disc bulges, posterior disc osteophytes and uncovertebral hypertrophy. Disc space narrowing is moderate/advanced at C3-C4, C4-C5 and C5-C6. Fusion across the C5-C6 disc space. Multilevel spinal canal stenosis. Most notably, there is apparent moderate spinal canal stenosis at C2-C3, C3-C4, C4-C5 and C5-C6. Multilevel bony neural foraminal narrowing. Upper chest: No consolidation within the imaged lung apices. No visible pneumothorax. IMPRESSION: CT head: 1. No evidence of acute intracranial abnormality. 2. Redemonstrated chronic lacunar infarcts within the left corona radiata/basal ganglia. 3. Background generalized parenchymal atrophy and chronic small vessel ischemic disease. CT maxillofacial: 1. No evidence of acute maxillofacial fracture. 2. Punctate hyperdense focus within the right periorbital soft tissues. This may reflect a calcification. Correlate with physical exam to exclude foreign body. 3. Paranasal sinus disease as described, most notably ethmoidal. CT cervical spine: 1. Subtle acute nondisplaced fracture of the C5 spinous  process. 2. Advanced cervical spondylosis with multilevel spinal canal and foraminal stenosis, as described. Degenerative fusion at C5-C6. Electronically Signed   By: Kellie Simmering DO   On: 10/24/2020 14:47   CT Cervical Spine Wo Contrast  Result Date: 10/24/2020 CLINICAL DATA:  Head trauma, minor. Neck trauma, intoxicated or up tended. Facial trauma. Additional history provided: Patient found down, unsure of injury or fall, patient reports pain all over, laceration with blood to left nostril EXAM: CT HEAD WITHOUT CONTRAST CT MAXILLOFACIAL WITHOUT CONTRAST CT CERVICAL SPINE WITHOUT CONTRAST TECHNIQUE: Multidetector CT imaging of the head, cervical spine, and maxillofacial structures were performed using the standard protocol without intravenous contrast. Multiplanar CT image reconstructions of the cervical spine and maxillofacial structures were also generated. COMPARISON:  Head CT  09/26/2011.  CT cervical spine 09/26/2011. FINDINGS: CT HEAD FINDINGS Brain: Moderate cerebral atrophy. Comparatively mild cerebellar atrophy. Redemonstrated chronic lacunar infarcts within the left corona radiata/basal ganglia. Background mild ill-defined hypoattenuation within the cerebral white matter is nonspecific, but compatible with chronic small vessel ischemic disease. There is no acute intracranial hemorrhage. No demarcated cortical infarct. No extra-axial fluid collection. No evidence of intracranial mass. No midline shift. Vascular: No hyperdense vessel.  Atherosclerotic calcifications. Skull: Normal. Negative for fracture or focal lesion. CT MAXILLOFACIAL FINDINGS Osseous: No acute maxillofacial fracture is identified. Orbits: No acute abnormality within the orbits. The globes are normal in size and contour. The extraocular muscles and optic nerve sheath complexes are symmetric and unremarkable. Sinuses: Mild mucosal thickening within the right frontal sinus. Moderate mucosal thickening within the bilateral ethmoid air  cells. Mild mucosal thickening within the right sphenoid sinus. Mild mucosal thickening within the right maxillary sinus. Trace mucosal thickening and small mucous retention cyst within the left maxillary sinus. Soft tissues: Punctate hyperdense focus within the right periorbital soft tissues (series 7, image 31). No definite maxillofacial soft tissue swelling or hematoma appreciable by CT. CT CERVICAL SPINE FINDINGS Alignment: No significant spondylolisthesis. Skull base and vertebrae: The basion-dental and atlanto-dental intervals are maintained.Subtle nondisplaced fracture of the C5 spinous process (series 21, image 51) (series 19, image 27). No other acute cervical spine fracture is identified. Soft tissues and spinal canal: No prevertebral fluid or swelling. No visible canal hematoma. Disc levels: Cervical spondylosis with multilevel disc space narrowing, disc bulges, posterior disc osteophytes and uncovertebral hypertrophy. Disc space narrowing is moderate/advanced at C3-C4, C4-C5 and C5-C6. Fusion across the C5-C6 disc space. Multilevel spinal canal stenosis. Most notably, there is apparent moderate spinal canal stenosis at C2-C3, C3-C4, C4-C5 and C5-C6. Multilevel bony neural foraminal narrowing. Upper chest: No consolidation within the imaged lung apices. No visible pneumothorax. IMPRESSION: CT head: 1. No evidence of acute intracranial abnormality. 2. Redemonstrated chronic lacunar infarcts within the left corona radiata/basal ganglia. 3. Background generalized parenchymal atrophy and chronic small vessel ischemic disease. CT maxillofacial: 1. No evidence of acute maxillofacial fracture. 2. Punctate hyperdense focus within the right periorbital soft tissues. This may reflect a calcification. Correlate with physical exam to exclude foreign body. 3. Paranasal sinus disease as described, most notably ethmoidal. CT cervical spine: 1. Subtle acute nondisplaced fracture of the C5 spinous process. 2. Advanced  cervical spondylosis with multilevel spinal canal and foraminal stenosis, as described. Degenerative fusion at C5-C6. Electronically Signed   By: Kellie Simmering DO   On: 10/24/2020 14:47   CT Maxillofacial Wo Contrast  Result Date: 10/24/2020 CLINICAL DATA:  Head trauma, minor. Neck trauma, intoxicated or up tended. Facial trauma. Additional history provided: Patient found down, unsure of injury or fall, patient reports pain all over, laceration with blood to left nostril EXAM: CT HEAD WITHOUT CONTRAST CT MAXILLOFACIAL WITHOUT CONTRAST CT CERVICAL SPINE WITHOUT CONTRAST TECHNIQUE: Multidetector CT imaging of the head, cervical spine, and maxillofacial structures were performed using the standard protocol without intravenous contrast. Multiplanar CT image reconstructions of the cervical spine and maxillofacial structures were also generated. COMPARISON:  Head CT 09/26/2011.  CT cervical spine 09/26/2011. FINDINGS: CT HEAD FINDINGS Brain: Moderate cerebral atrophy. Comparatively mild cerebellar atrophy. Redemonstrated chronic lacunar infarcts within the left corona radiata/basal ganglia. Background mild ill-defined hypoattenuation within the cerebral white matter is nonspecific, but compatible with chronic small vessel ischemic disease. There is no acute intracranial hemorrhage. No demarcated cortical infarct. No extra-axial fluid collection. No evidence  of intracranial mass. No midline shift. Vascular: No hyperdense vessel.  Atherosclerotic calcifications. Skull: Normal. Negative for fracture or focal lesion. CT MAXILLOFACIAL FINDINGS Osseous: No acute maxillofacial fracture is identified. Orbits: No acute abnormality within the orbits. The globes are normal in size and contour. The extraocular muscles and optic nerve sheath complexes are symmetric and unremarkable. Sinuses: Mild mucosal thickening within the right frontal sinus. Moderate mucosal thickening within the bilateral ethmoid air cells. Mild mucosal  thickening within the right sphenoid sinus. Mild mucosal thickening within the right maxillary sinus. Trace mucosal thickening and small mucous retention cyst within the left maxillary sinus. Soft tissues: Punctate hyperdense focus within the right periorbital soft tissues (series 7, image 31). No definite maxillofacial soft tissue swelling or hematoma appreciable by CT. CT CERVICAL SPINE FINDINGS Alignment: No significant spondylolisthesis. Skull base and vertebrae: The basion-dental and atlanto-dental intervals are maintained.Subtle nondisplaced fracture of the C5 spinous process (series 21, image 51) (series 19, image 27). No other acute cervical spine fracture is identified. Soft tissues and spinal canal: No prevertebral fluid or swelling. No visible canal hematoma. Disc levels: Cervical spondylosis with multilevel disc space narrowing, disc bulges, posterior disc osteophytes and uncovertebral hypertrophy. Disc space narrowing is moderate/advanced at C3-C4, C4-C5 and C5-C6. Fusion across the C5-C6 disc space. Multilevel spinal canal stenosis. Most notably, there is apparent moderate spinal canal stenosis at C2-C3, C3-C4, C4-C5 and C5-C6. Multilevel bony neural foraminal narrowing. Upper chest: No consolidation within the imaged lung apices. No visible pneumothorax. IMPRESSION: CT head: 1. No evidence of acute intracranial abnormality. 2. Redemonstrated chronic lacunar infarcts within the left corona radiata/basal ganglia. 3. Background generalized parenchymal atrophy and chronic small vessel ischemic disease. CT maxillofacial: 1. No evidence of acute maxillofacial fracture. 2. Punctate hyperdense focus within the right periorbital soft tissues. This may reflect a calcification. Correlate with physical exam to exclude foreign body. 3. Paranasal sinus disease as described, most notably ethmoidal. CT cervical spine: 1. Subtle acute nondisplaced fracture of the C5 spinous process. 2. Advanced cervical spondylosis  with multilevel spinal canal and foraminal stenosis, as described. Degenerative fusion at C5-C6. Electronically Signed   By: Kellie Simmering DO   On: 10/24/2020 14:47    Procedures Procedures   Medications Ordered in ED Medications - No data to display  ED Course  I have reviewed the triage vital signs and the nursing notes.  Pertinent labs & imaging results that were available during my care of the patient were reviewed by me and considered in my medical decision making (see chart for details).  Clinical Course as of 10/24/20 2050  Thu Oct 25, 1522  [id number] 80 year old male brought in by EMS after being found down in his yard.  Admits to alcohol.  Incontinent of stool.  Minor signs of facial trauma so getting a head CT.  Patient is quite belligerent [MB]    Clinical Course User Index [MB] Hayden Rasmussen, MD   MDM Rules/Calculators/A&P                           8:50 PM Ct imaging, labs reviewed. Pt with etoh level of 149, too unstable to attempt ambulation at this time.  Ct c spine positive for C5 spinous process fx.  Discussed finding with Dr. Marcello Moores with neurosurgery.  Pt does not need neurosurgical care, would recommend soft collar for comfort, plan f/u with pcp who can follow this injury.   Soft collar was ordered for patient's cervical  injury.  He was boarded in the department awaiting sobriety.  We will plan discharge home once he is felt safe for discharge.  He will need follow-up care with his primary MD regarding his cervical findings.  Care assumed by Kem Parkinson, PA-C pending sobriety    Final Clinical Impression(s) / ED Diagnoses Final diagnoses:  Alcoholic intoxication without complication ([place])  Other closed nondisplaced fracture of fifth cervical vertebra, initial encounter [facility])    Rx / [place] Orders ED Discharge Orders    None       Landis Martins 10/24/20 2051    Milton Ferguson, MD 10/24/20 2229

## 2020-10-24 NOTE — ED Notes (Signed)
Please contact [facility] [phone] for d/c to pick up [name]

## 2020-10-24 NOTE — ED Provider Notes (Signed)
   Patient here via EMS for alcohol intoxication, found earlier today lying in his yard.  Evaluated earlier today and had blood alcohol of 149.  Patient had CT imaging and noted to have a subtle nondisplaced fracture of the C5 spinous process.  Neurosurgery was consulted and recommended patient be placed in soft cervical collar.  Patient refused.  Pt signed out to me by Evalee Jefferson, PA-C with plan of discharge home once sober  2230  On my exam, patient appears to be mentating well.  He has tolerated oral fluids and crackers.  At this time, patient appears appropriate for discharge home, Alyse Low who is patient's caregiver, agrees to pick him up and assume care.   Kem Parkinson, PA-C 10/24/20 2306    Milton Ferguson, MD 10/25/20 [phone]

## 2020-10-24 NOTE — ED Notes (Signed)
Pt refusing vitals at this time.

## 2020-10-24 NOTE — ED Triage Notes (Signed)
Pt brought to ED via [facility] for alcohol intoxication. EMS was called out, pt laying in yard, Pt has abrasion to nose and lips. Pt intoxicated and states been drinking liquor.

## 2020-10-24 NOTE — ED Notes (Addendum)
Pt offered fluids and food.

## 2020-10-24 NOTE — ED Notes (Addendum)
Pt cleaned and changed. Linens changed. Pt with diarrhea. Pt will attempt to help turn in bed to change himself. Pt moving legs, minimal movement to bilateral arms.

## 2020-10-24 NOTE — Discharge Instructions (Addendum)
As discussed, you have a nondisplaced fracture of one of the vertebrae in your neck.  We have recommended that you wear a soft collar.  Your blood alcohol level was elevated today.  Please try to stop drinking alcohol.  Follow-up with Dr. Karie Kirks regarding your neck injury.

## 2020-10-24 NOTE — ED Notes (Signed)
Pt given cervical collar, pt refused to wear it.

## 2020-10-24 NOTE — ED Notes (Signed)
Pt moving all 4 extremities voluntarily at this time.

## 2021-03-02 ENCOUNTER — Other Ambulatory Visit: Payer: Self-pay

## 2021-03-02 ENCOUNTER — Encounter (HOSPITAL_COMMUNITY): Payer: Self-pay

## 2021-03-02 ENCOUNTER — Emergency Department (HOSPITAL_COMMUNITY): Payer: Medicare Other

## 2021-03-02 ENCOUNTER — Inpatient Hospital Stay (HOSPITAL_COMMUNITY)
Admission: EM | Admit: 2021-03-02 | Discharge: 2021-03-06 | DRG: 064 | Disposition: A | Discharge status: Skilled Nursing Facility | Payer: Medicare Other | Attending: Internal Medicine | Admitting: Internal Medicine

## 2021-03-02 DIAGNOSIS — Z823 Family history of stroke: Secondary | ICD-10-CM

## 2021-03-02 DIAGNOSIS — J9811 Atelectasis: Secondary | ICD-10-CM | POA: Diagnosis not present

## 2021-03-02 DIAGNOSIS — R1313 Dysphagia, pharyngeal phase: Secondary | ICD-10-CM | POA: Diagnosis not present

## 2021-03-02 DIAGNOSIS — I771 Stricture of artery: Secondary | ICD-10-CM | POA: Diagnosis not present

## 2021-03-02 DIAGNOSIS — G319 Degenerative disease of nervous system, unspecified: Secondary | ICD-10-CM | POA: Diagnosis not present

## 2021-03-02 DIAGNOSIS — Z7401 Bed confinement status: Secondary | ICD-10-CM | POA: Diagnosis not present

## 2021-03-02 DIAGNOSIS — I444 Left anterior fascicular block: Secondary | ICD-10-CM | POA: Diagnosis present

## 2021-03-02 DIAGNOSIS — G9341 Metabolic encephalopathy: Secondary | ICD-10-CM | POA: Diagnosis present

## 2021-03-02 DIAGNOSIS — I6523 Occlusion and stenosis of bilateral carotid arteries: Secondary | ICD-10-CM | POA: Diagnosis not present

## 2021-03-02 DIAGNOSIS — I708 Atherosclerosis of other arteries: Secondary | ICD-10-CM | POA: Diagnosis present

## 2021-03-02 DIAGNOSIS — Z79899 Other long term (current) drug therapy: Secondary | ICD-10-CM | POA: Diagnosis not present

## 2021-03-02 DIAGNOSIS — R1311 Dysphagia, oral phase: Secondary | ICD-10-CM | POA: Diagnosis not present

## 2021-03-02 DIAGNOSIS — I10 Essential (primary) hypertension: Secondary | ICD-10-CM | POA: Diagnosis not present

## 2021-03-02 DIAGNOSIS — F1010 Alcohol abuse, uncomplicated: Secondary | ICD-10-CM | POA: Diagnosis present

## 2021-03-02 DIAGNOSIS — F17290 Nicotine dependence, other tobacco product, uncomplicated: Secondary | ICD-10-CM | POA: Diagnosis present

## 2021-03-02 DIAGNOSIS — Z8249 Family history of ischemic heart disease and other diseases of the circulatory system: Secondary | ICD-10-CM

## 2021-03-02 DIAGNOSIS — I639 Cerebral infarction, unspecified: Secondary | ICD-10-CM | POA: Diagnosis not present

## 2021-03-02 DIAGNOSIS — M545 Low back pain, unspecified: Secondary | ICD-10-CM | POA: Diagnosis present

## 2021-03-02 DIAGNOSIS — G8194 Hemiplegia, unspecified affecting left nondominant side: Secondary | ICD-10-CM | POA: Diagnosis not present

## 2021-03-02 DIAGNOSIS — Z66 Do not resuscitate: Secondary | ICD-10-CM | POA: Diagnosis not present

## 2021-03-02 DIAGNOSIS — I6389 Other cerebral infarction: Secondary | ICD-10-CM | POA: Diagnosis not present

## 2021-03-02 DIAGNOSIS — Z743 Need for continuous supervision: Secondary | ICD-10-CM | POA: Diagnosis not present

## 2021-03-02 DIAGNOSIS — Z7189 Other specified counseling: Secondary | ICD-10-CM | POA: Diagnosis not present

## 2021-03-02 DIAGNOSIS — E782 Mixed hyperlipidemia: Secondary | ICD-10-CM | POA: Diagnosis not present

## 2021-03-02 DIAGNOSIS — R4182 Altered mental status, unspecified: Secondary | ICD-10-CM

## 2021-03-02 DIAGNOSIS — R471 Dysarthria and anarthria: Secondary | ICD-10-CM | POA: Diagnosis not present

## 2021-03-02 DIAGNOSIS — R4781 Slurred speech: Secondary | ICD-10-CM | POA: Diagnosis not present

## 2021-03-02 DIAGNOSIS — R531 Weakness: Secondary | ICD-10-CM | POA: Diagnosis not present

## 2021-03-02 DIAGNOSIS — I672 Cerebral atherosclerosis: Secondary | ICD-10-CM | POA: Diagnosis not present

## 2021-03-02 DIAGNOSIS — R29707 NIHSS score 7: Secondary | ICD-10-CM | POA: Diagnosis not present

## 2021-03-02 DIAGNOSIS — Z20822 Contact with and (suspected) exposure to covid-19: Secondary | ICD-10-CM | POA: Diagnosis present

## 2021-03-02 DIAGNOSIS — Z515 Encounter for palliative care: Secondary | ICD-10-CM | POA: Diagnosis not present

## 2021-03-02 DIAGNOSIS — E870 Hyperosmolality and hypernatremia: Secondary | ICD-10-CM | POA: Diagnosis not present

## 2021-03-02 DIAGNOSIS — N179 Acute kidney failure, unspecified: Secondary | ICD-10-CM | POA: Diagnosis present

## 2021-03-02 DIAGNOSIS — D72829 Elevated white blood cell count, unspecified: Secondary | ICD-10-CM | POA: Diagnosis not present

## 2021-03-02 DIAGNOSIS — Z9181 History of falling: Secondary | ICD-10-CM | POA: Diagnosis not present

## 2021-03-02 DIAGNOSIS — E860 Dehydration: Secondary | ICD-10-CM | POA: Diagnosis not present

## 2021-03-02 DIAGNOSIS — R279 Unspecified lack of coordination: Secondary | ICD-10-CM | POA: Diagnosis not present

## 2021-03-02 DIAGNOSIS — R29898 Other symptoms and signs involving the musculoskeletal system: Secondary | ICD-10-CM | POA: Diagnosis not present

## 2021-03-02 DIAGNOSIS — I6312 Cerebral infarction due to embolism of basilar artery: Secondary | ICD-10-CM | POA: Diagnosis not present

## 2021-03-02 DIAGNOSIS — G8929 Other chronic pain: Secondary | ICD-10-CM | POA: Diagnosis present

## 2021-03-02 DIAGNOSIS — Z8673 Personal history of transient ischemic attack (TIA), and cerebral infarction without residual deficits: Secondary | ICD-10-CM | POA: Diagnosis not present

## 2021-03-02 DIAGNOSIS — M1990 Unspecified osteoarthritis, unspecified site: Secondary | ICD-10-CM | POA: Diagnosis present

## 2021-03-02 DIAGNOSIS — R6889 Other general symptoms and signs: Secondary | ICD-10-CM | POA: Diagnosis not present

## 2021-03-02 HISTORY — DX: Alcohol abuse, uncomplicated: F10.10

## 2021-03-02 LAB — COMPREHENSIVE METABOLIC PANEL
ALT: 22 U/L (ref 0–44)
AST: 29 U/L (ref 15–41)
Albumin: 4 g/dL (ref 3.5–5.0)
Alkaline Phosphatase: 72 U/L (ref 38–126)
Anion gap: 18 — ABNORMAL HIGH (ref 5–15)
BUN: 26 mg/dL — ABNORMAL HIGH (ref 8–23)
CO2: 20 mmol/L — ABNORMAL LOW (ref 22–32)
Calcium: 9.4 mg/dL (ref 8.9–10.3)
Chloride: 102 mmol/L (ref 98–111)
Creatinine, Ser: 1.43 mg/dL — ABNORMAL HIGH (ref 0.61–1.24)
GFR, Estimated: 50 mL/min — ABNORMAL LOW (ref >60)
Glucose, Bld: 195 mg/dL — ABNORMAL HIGH (ref 70–99)
Potassium: 4.1 mmol/L (ref 3.5–5.1)
Sodium: 140 mmol/L (ref 135–145)
Total Bilirubin: 1.6 mg/dL — ABNORMAL HIGH (ref 0.3–1.2)
Total Protein: 7.9 g/dL (ref 6.5–8.1)

## 2021-03-02 LAB — CBC WITH DIFFERENTIAL/PLATELET
Band Neutrophils: 12 %
Basophils Absolute: 0 10*3/uL (ref 0.0–0.1)
Basophils Relative: 0 %
Eosinophils Absolute: 0 10*3/uL (ref 0.0–0.5)
Eosinophils Relative: 0 %
HCT: 49.2 % (ref 39.0–52.0)
Hemoglobin: 15.8 g/dL (ref 13.0–17.0)
Lymphocytes Relative: 15 %
Lymphs Abs: 0.8 10*3/uL (ref 0.7–4.0)
MCH: 30.6 pg (ref 26.0–34.0)
MCHC: 32.1 g/dL (ref 30.0–36.0)
MCV: 95.3 fL (ref 80.0–100.0)
Metamyelocytes Relative: 3 %
Monocytes Absolute: 0.2 10*3/uL (ref 0.1–1.0)
Monocytes Relative: 4 %
Neutro Abs: 4.1 10*3/uL (ref 1.7–7.7)
Neutrophils Relative %: 66 %
Platelets: 227 10*3/uL (ref 150–400)
RBC: 5.16 MIL/uL (ref 4.22–5.81)
RDW: 15.2 % (ref 11.5–15.5)
WBC: 5.3 10*3/uL (ref 4.0–10.5)
nRBC: 0 % (ref 0.0–0.2)

## 2021-03-02 LAB — RESP PANEL BY RT-PCR (FLU A&B, COVID) ARPGX2
Influenza A by PCR: NEGATIVE
Influenza B by PCR: NEGATIVE
SARS Coronavirus 2 by RT PCR: NEGATIVE

## 2021-03-02 LAB — MAGNESIUM: Magnesium: 1.5 mg/dL — ABNORMAL LOW (ref 1.7–2.4)

## 2021-03-02 LAB — TROPONIN I (HIGH SENSITIVITY)
Troponin I (High Sensitivity): 44 ng/L — ABNORMAL HIGH (ref <18)
Troponin I (High Sensitivity): 41 ng/L — ABNORMAL HIGH (ref <18)

## 2021-03-02 LAB — APTT: aPTT: 26 seconds (ref 24–36)

## 2021-03-02 LAB — PHOSPHORUS: Phosphorus: 2.2 mg/dL — ABNORMAL LOW (ref 2.5–4.6)

## 2021-03-02 LAB — PROTIME-INR
INR: 1.2 (ref 0.8–1.2)
Prothrombin Time: 15.2 seconds (ref 11.4–15.2)

## 2021-03-02 LAB — ETHANOL: Alcohol, Ethyl (B): <10 mg/dL (ref <10)

## 2021-03-02 MED ORDER — THIAMINE HCL 100 MG/ML IJ SOLN
100.0000 mg | Freq: Every day | INTRAMUSCULAR | Status: DC
  Administered 2021-03-02 – 2021-03-04 (×3): 100 mg via INTRAVENOUS
  Filled 2021-03-02 (×3): qty 2

## 2021-03-02 MED ORDER — ATORVASTATIN CALCIUM 40 MG PO TABS
40.0000 mg | ORAL_TABLET | Freq: Every day | ORAL | Status: DC
  Administered 2021-03-02: 40 mg via ORAL
  Filled 2021-03-02: qty 1

## 2021-03-02 MED ORDER — ADULT MULTIVITAMIN W/MINERALS CH
1.0000 | ORAL_TABLET | Freq: Every day | ORAL | Status: DC
  Administered 2021-03-02 – 2021-03-06 (×3): 1 via ORAL
  Filled 2021-03-02 (×3): qty 1

## 2021-03-02 MED ORDER — SODIUM CHLORIDE 0.9 % IV SOLN
100.0000 mL/h | INTRAVENOUS | Status: DC
  Administered 2021-03-02 – 2021-03-03 (×2): 100 mL/h via INTRAVENOUS

## 2021-03-02 MED ORDER — THIAMINE HCL 100 MG PO TABS
100.0000 mg | ORAL_TABLET | Freq: Every day | ORAL | Status: DC
  Administered 2021-03-05 – 2021-03-06 (×2): 100 mg via ORAL
  Filled 2021-03-02 (×2): qty 1

## 2021-03-02 MED ORDER — ACETAMINOPHEN 650 MG RE SUPP: 650.0000 mg | Freq: Four times a day (QID) | RECTAL | Status: DC | PRN

## 2021-03-02 MED ORDER — LORAZEPAM 1 MG PO TABS: 1.0000 mg | ORAL_TABLET | ORAL | Status: AC | PRN

## 2021-03-02 MED ORDER — ONDANSETRON HCL 4 MG/2ML IJ SOLN: 4.0000 mg | Freq: Four times a day (QID) | INTRAMUSCULAR | Status: DC | PRN

## 2021-03-02 MED ORDER — LORAZEPAM 2 MG/ML IJ SOLN: 1.0000 mg | INTRAMUSCULAR | Status: AC | PRN

## 2021-03-02 MED ORDER — POLYETHYLENE GLYCOL 3350 17 G PO PACK: 17.0000 g | PACK | Freq: Every day | ORAL | Status: DC | PRN

## 2021-03-02 MED ORDER — FOLIC ACID 1 MG PO TABS
1.0000 mg | ORAL_TABLET | Freq: Every day | ORAL | Status: DC
  Administered 2021-03-02: 1 mg via ORAL
  Filled 2021-03-02: qty 1

## 2021-03-02 MED ORDER — ONDANSETRON HCL 4 MG PO TABS: 4.0000 mg | ORAL_TABLET | Freq: Four times a day (QID) | ORAL | Status: DC | PRN

## 2021-03-02 MED ORDER — ENOXAPARIN SODIUM 40 MG/0.4ML IJ SOSY
40.0000 mg | PREFILLED_SYRINGE | INTRAMUSCULAR | Status: DC
  Administered 2021-03-02 – 2021-03-05 (×4): 40 mg via SUBCUTANEOUS
  Filled 2021-03-02 (×4): qty 0.4

## 2021-03-02 MED ORDER — ACETAMINOPHEN 325 MG PO TABS: 650.0000 mg | ORAL_TABLET | Freq: Four times a day (QID) | ORAL | Status: DC | PRN

## 2021-03-02 MED ORDER — MAGNESIUM SULFATE 2 GM/50ML IV SOLN
2.0000 g | Freq: Once | INTRAVENOUS | Status: AC
  Administered 2021-03-02: 2 g via INTRAVENOUS
  Filled 2021-03-02: qty 50

## 2021-03-02 MED ORDER — SODIUM CHLORIDE 0.9 % IV BOLUS
500.0000 mL | Freq: Once | INTRAVENOUS | Status: AC
  Administered 2021-03-02: 500 mL via INTRAVENOUS

## 2021-03-02 MED ORDER — SODIUM CHLORIDE 0.9 % IV SOLN
100.0000 mL/h | INTRAVENOUS | Status: DC
  Administered 2021-03-02: 100 mL/h via INTRAVENOUS

## 2021-03-02 MED ORDER — ASPIRIN EC 81 MG PO TBEC: 81.0000 mg | DELAYED_RELEASE_TABLET | Freq: Every day | ORAL | Status: DC

## 2021-03-02 MED ORDER — ASPIRIN 325 MG PO TABS
325.0000 mg | ORAL_TABLET | Freq: Once | ORAL | Status: AC
  Administered 2021-03-02: 325 mg via ORAL
  Filled 2021-03-02: qty 1

## 2021-03-02 MED ORDER — IOHEXOL 350 MG/ML SOLN
100.0000 mL | Freq: Once | INTRAVENOUS | Status: AC | PRN
  Administered 2021-03-02: 75 mL via INTRAVENOUS

## 2021-03-02 MED ORDER — SODIUM CHLORIDE 0.9 % IV BOLUS
1000.0000 mL | Freq: Once | INTRAVENOUS | Status: AC
  Administered 2021-03-02: 1000 mL via INTRAVENOUS

## 2021-03-02 NOTE — ED Provider Notes (Signed)
[facility] EMERGENCY DEPARTMENT Provider Note   CSN: [id number] Arrival date & time: 03/02/21  1258  An emergency department physician performed an initial assessment on this suspected stroke patient at 1315.  History Chief Complaint  Patient presents with   Altered Mental Status    [name] is a 80 y.o. male.  HPI  HPI will deferred due to level 5 caveat altered mental status.  Patient with significant medical history of EtOH abuse, hypertension, presents to the emergent department with chief complaint of altered mental status.  HPI was collected from triage who endorse that family states that patient was not acting right for the last 2 hours, and has been recently falling a lot lately.  Patient was able to answer my questions, he denies having any sort of pain, states that he is just cold.  He denies headaches, chest pain, abdominal pain, or pain in his upper or lower extremities.  After reviewing patient's chart patient has history of EtOH abuse, was last seen in March for a similar presentation, found to have a elevated alcohol content and was later discharged home.  No history CVA,  anticoagulants use, drug use, does have hypertension, unclear if he smokes nicotine.  Attempted to contact family for further information,  Ms. Cobb patient's daughter at [phone] but was unable to come into contact with anyone.  Past Medical History:  Diagnosis Date   ETOH abuse    Hypertension     Patient Active Problem List   Diagnosis Date Noted   Acute CVA (cerebrovascular accident) ([place]) 03/02/2021   Normocytic anemia 03/14/2013   Heme positive stool 03/14/2013   BACK PAIN, LUMBAR 16/96/7893   HERNIA, UMBILICAL 81/08/7508   MIXED HYPERLIPIDEMIA 08/17/2007   ALCOHOL ABUSE 05/17/2007   HYPERTENSION 05/17/2007   TOBACCO ABUSE 05/02/2007   PROTEINURIA 05/02/2007   ELECTROCARDIOGRAM, ABNORMAL 05/02/2007    Past Surgical History:  Procedure Laterality Date   COLONOSCOPY   about 7-8 yrs ago   COLONOSCOPY N/A 03/27/2013   Procedure: COLONOSCOPY;  Surgeon: Daneil Dolin, MD;  Location: [facility];  Service: Endoscopy;  Laterality: N/A;  10;30   ESOPHAGOGASTRODUODENOSCOPY N/A 03/27/2013   Procedure: ESOPHAGOGASTRODUODENOSCOPY (EGD);  Surgeon: Daneil Dolin, MD;  Location: [facility];  Service: Endoscopy;  Laterality: N/A;   LEG SURGERY     left       Family History  Problem Relation Age of Onset   Stroke Mother    Heart failure Father    Colon cancer Neg Hx    Liver disease Neg Hx    Ulcers Neg Hx     Social History   Tobacco Use   Smoking status: Every Day    Pack years: 0.00    Types: Cigars   Smokeless tobacco: Never  Substance Use Topics   Alcohol use: Yes    Comment: Unknown   Drug use: No    Home Medications Prior to Admission medications   Medication Sig Start Date End Date Taking? Authorizing Provider  amoxicillin-clarithromycin-lansoprazole [facility]) combo pack Take by mouth 2 (two) times daily. Follow package directions. Patient not taking: No sig reported 04/06/13   Rourk, Cristopher Estimable, MD  losartan-hydrochlorothiazide (HYZAAR) 100-25 MG per tablet Take 1 tablet by mouth daily.  Patient not taking: Reported on 10/24/2020 01/17/13   [provider]  nabumetone (RELAFEN) 750 MG tablet Take 750 mg by mouth 2 (two) times daily.  Patient not taking: Reported on 10/24/2020 01/20/13   [provider]  ondansetron (ZOFRAN ODT) 4 MG disintegrating tablet 4mg  ODT q4 hours prn nausea/vomit Patient not taking: No sig reported 12/31/18   Milton Ferguson, MD  pantoprazole (PROTONIX) 20 MG tablet Take 1 tablet (20 mg total) by mouth daily. Patient not taking: No sig reported 12/31/18   Milton Ferguson, MD  polyethylene glycol-electrolytes (TRILYTE) 420 G solution Take 4,000 mLs by mouth as directed. Patient not taking: Reported on 10/24/2020 03/14/13   Rourk, Cristopher Estimable, MD    Allergies    Patient has no known allergies.  Review of Systems    Review of Systems  Unable to perform ROS: Mental status change   Physical Exam Updated Vital Signs BP (!) 164/78   Pulse (!) 101   Temp 98.9 F (37.2 C) (Oral)   Resp (!) 21   Ht 5\' 10"  (1.778 m)   Wt 63.5 kg   SpO2 96%   BMI 20.09 kg/m   Physical Exam Vitals and nursing note reviewed.  Constitutional:      General: He is not in acute distress.    Appearance: He is not ill-appearing.  HENT:     Head: Normocephalic and atraumatic.     Nose: No congestion.     Mouth/Throat:     Mouth: Mucous membranes are moist.     Pharynx: Oropharynx is clear.  Eyes:     Extraocular Movements: Extraocular movements intact.     Conjunctiva/sclera: Conjunctivae normal.     Pupils: Pupils are equal, round, and reactive to light.  Cardiovascular:     Rate and Rhythm: Normal rate and regular rhythm.     Pulses: Normal pulses.     Heart sounds: No murmur heard.   No friction rub. No gallop.  Pulmonary:     Effort: No respiratory distress.     Breath sounds: No wheezing, rhonchi or rales.  Abdominal:     Palpations: Abdomen is soft.     Tenderness: There is no abdominal tenderness.  Musculoskeletal:     Right lower leg: No edema.     Left lower leg: No edema.  Skin:    General: Skin is warm and dry.  Neurological:     Mental Status: He is alert.     GCS: GCS eye subscore is 4. GCS verbal subscore is 5. GCS motor subscore is 6.     Sensory: Sensation is intact.     Comments: No noted facial asymmetry, patient was responding appropriately to questions, no obvious unilateral weakness, able to follow basic commands.    Psychiatric:        Mood and Affect: Mood normal.    ED Results / Procedures / Treatments   Labs (all labs ordered are listed, but only abnormal results are displayed) Labs Reviewed  COMPREHENSIVE METABOLIC PANEL - Abnormal; Notable for the following components:      Result Value   CO2 20 (*)    Glucose, Bld 195 (*)    BUN 26 (*)    Creatinine, Ser 1.43 (*)     Total Bilirubin 1.6 (*)    GFR, Estimated 50 (*)    Anion gap 18 (*)    All other components within normal limits  TROPONIN I (HIGH SENSITIVITY) - Abnormal; Notable for the following components:   Troponin I (High Sensitivity) 44 (*)    All other components within normal limits  TROPONIN I (HIGH SENSITIVITY) - Abnormal; Notable for the following components:   Troponin I (High Sensitivity) 41 (*)    All other  components within normal limits  RESP PANEL BY RT-PCR (FLU A&B, COVID) ARPGX2  CBC WITH DIFFERENTIAL/PLATELET  ETHANOL  PROTIME-INR  APTT  RAPID URINE DRUG SCREEN, HOSP PERFORMED  URINALYSIS, ROUTINE W REFLEX MICROSCOPIC    EKG EKG Interpretation  Date/Time:  Sunday March 02 2021 14:00:26 EDT Ventricular Rate:  99 PR Interval:  118 QRS Duration: 112 QT Interval:  348 QTC Calculation: 447 R Axis:   -62 Text Interpretation: Sinus rhythm LAD, consider left anterior fascicular block LVH with secondary repolarization abnormality Anterior infarct, acute (LAD) NO SIGNIFICANT CHANGE SINCE LAST TRACING YESTERDAY Confirmed by Fredia Sorrow [phone]) on 03/02/2021 2:08:24 PM  Radiology DG Chest 1 View  Result Date: 03/02/2021 CLINICAL DATA:  Stroke-like symptoms. Altered mental status. Smoker. EXAM: CHEST  1 VIEW COMPARISON:  09/26/2011. FINDINGS: Midline trachea. Normal heart size. Atherosclerosis in the transverse aorta. No pleural effusion or pneumothorax. Basilar and peripheral predominant interstitial thickening is greater left than right. Given differences in technique, relatively similar to 2013. IMPRESSION: Left greater than right basilar predominant interstitial opacities are similar to 2013. Possibly related to chronic bronchitis/smoking. Left lower lobe pneumonia cannot be entirely excluded. Interstitial lung disease is possible, but the lung bases on the 12/31/2018 CT appear relatively uninvolved. Electronically Signed   By: Abigail Miyamoto M.D.   On: 03/02/2021 14:44   CT  Head Wo Contrast  Result Date: 03/02/2021 CLINICAL DATA:  80 year old male with altered mental status. EXAM: CT HEAD WITHOUT CONTRAST TECHNIQUE: Contiguous axial images were obtained from the base of the skull through the vertex without intravenous contrast. COMPARISON:  10/24/2020 CT FINDINGS: Brain: A new RIGHT caudate/basal ganglia infarct is noted since 10/24/2020. Atrophy, chronic small-vessel white matter ischemic changes and remote LEFT basal ganglia infarcts again noted. There is no evidence of hemorrhage, extra-axial collection, mass lesion, midline shift or hydrocephalus. Vascular: Carotid atherosclerotic calcifications are noted. Skull: Normal. Negative for fracture or focal lesion. Sinuses/Orbits: No acute finding. Other: None IMPRESSION: 1. New RIGHT caudate/basal ganglia infarct since 10/24/2020 - likely acute to subacute. No evidence of hemorrhage. 2. Atrophy, chronic small-vessel white matter ischemic changes and remote LEFT basal ganglia infarcts. Electronically Signed   By: Margarette Canada M.D.   On: 03/02/2021 14:22    Procedures Procedures   Medications Ordered in ED Medications  sodium chloride 0.9 % bolus 500 mL (0 mLs Intravenous Stopped 03/02/21 1613)    Followed by  0.9 %  sodium chloride infusion (has no administration in time range)  sodium chloride 0.9 % bolus 1,000 mL (0 mLs Intravenous Stopped 03/02/21 1614)  iohexol (OMNIPAQUE) 350 MG/ML injection 100 mL (75 mLs Intravenous Contrast Given 03/02/21 1623)    ED Course  I have reviewed the triage vital signs and the nursing notes.  Pertinent labs & imaging results that were available during my care of the patient were reviewed by me and considered in my medical decision making (see chart for details).    MDM Rules/Calculators/A&P                         Initial impression-patient presents with altered mental status.  He is alert, does not appear acute stress, vital signs reassuring.  Will obtained head CT, basic lab  work-up and reassess.  Work-up-CBC unremarkable, CMP shows decreased CO2 of 20, glucose 196, creatinine 1.4, T bili 1.6, ethanol less than 10, prothrombin time unremarkable, INR unremarkable, CT head reveals new right caudal/basilar ganglia infarct new since 03/03 likely acute to subacute.  Reassessment-due to altered mental status with acute to subacute infarct seen on CT head with possible last known well within the last 4 hours will initiate code stroke.  Consult-spoke with Dr. Leonel Ramsay, does not feel that patient is not candidate for tPA at this time as it is unclear when symptoms started.  He recommends hospital admission,  follow-up with neurology consult tomorrow, he will place a CTA head and neck for today, will  need MRI tomorrow.   Spoke with Dr. Denton Brick the hospitalist team she will admit the patient.  Rule out-low suspicion for systemic infection as patient is nontoxic-appearing, vital signs reassuring, no leukocytosis seen on CBC.  Low suspicion for alcohol intoxication as his ethanol is less than 10.  Low suspicion for hepatic encephalopathy as there is no elevation liver enzymes, T bili is only mildly elevated at 1.6, patient is not jaundiced on my exam.  Low suspicion for UTI, patient denies urinary symptoms, no leukocytosis seen on CBC.  Patient is tachycardic here on my exam possible he might be going through alcohol withdrawal as he is a chronic drinker.  low suspicion for ACS as patient denies chest pain, shortness of breath, EKG without signs of ischemia.  He does have noted troponin 41, suspect this is more demand ischemia versus  poor excretion as patient has a GFR of 50.  Possibly could be their baseline.  Recommend trending troponins for further evaluation.  Plan-admit to medicine due to altered mental status likely due to subacute basal ganglia infarct.    Final Clinical Impression(s) / ED Diagnoses Final diagnoses:  Altered mental status, unspecified altered mental  status type  Cerebral infarction, unspecified mechanism [facility])    Rx / DC Orders ED Discharge Orders     None        Marcello Fennel, PA-C 03/02/21 1658    Fredia Sorrow, MD 03/04/21 [phone]

## 2021-03-02 NOTE — ED Provider Notes (Signed)
I provided a substantive portion of the care of this patient.  I personally performed the entirety of the history, exam, and medical decision making for this encounter.  CRITICAL CARE Performed by: Fredia Sorrow Total critical care time: 45 minutes Critical care time was exclusive of separately billable procedures and treating other patients. Critical care was necessary to treat or prevent imminent or life-threatening deterioration. Critical care was time spent personally by me on the following activities: development of treatment plan with patient and/or surrogate as well as nursing, discussions with consultants, evaluation of patient's response to treatment, examination of patient, obtaining history from patient or surrogate, ordering and performing treatments and interventions, ordering and review of laboratory studies, ordering and review of radiographic studies, pulse oximetry and re-evaluation of patient's condition.    EKG Interpretation  Date/Time:  Sunday March 02 2021 14:00:26 EDT Ventricular Rate:  99 PR Interval:  118 QRS Duration: 112 QT Interval:  348 QTC Calculation: 447 R Axis:   -62 Text Interpretation: Sinus rhythm LAD, consider left anterior fascicular block LVH with secondary repolarization abnormality Anterior infarct, acute (LAD) NO SIGNIFICANT CHANGE SINCE LAST TRACING YESTERDAY Confirmed by Fredia Sorrow [phone]) on 03/02/2021 2:08:24 PM   Patient brought in by EMS.  Concerns for possible stroke.  Timeline reported to be normal 2 hours prior.  We tried to call family members could not get that confirmed.  But patient certainly did not have any significant neurodeficit.  Speech was okay.  Patient is often seen for alcohol intoxication.  That was a consideration but his alcohol levels were normal.  He is not intoxicated.  CT had showed evidence of an acute infarct.  Based on that we activated code stroke did not feel he would be a tPA candidate.  Seen by Dr. Leonel Ramsay.   He recommends admission.  Admission could be here because MRI could be done tomorrow.  He is ordering CT angio head and neck just to be on the safe side.  Patient's main deficit and currently is a little bit of left leg weakness.  Which would go along with the caudate infarct.   Fredia Sorrow, MD 03/02/21 1530

## 2021-03-02 NOTE — ED Notes (Addendum)
Patient unable to sign MSE due to AMS. PA in room at time of triage to assess patient. No code stroke initiated per PA/MD.

## 2021-03-02 NOTE — ED Notes (Signed)
Checked pt brief still dry

## 2021-03-02 NOTE — ED Notes (Signed)
While speaking with Neurologist, patient states that he woke yesterday morning with slurred speech.

## 2021-03-02 NOTE — ED Triage Notes (Signed)
Patient to ED from home after family called out for stroke like symptoms. States that patient has not been talking normal for possibly last two hours. Patient is heavily soiled on arrival, will only say "I'm hungry." Family reports that patient drinks excessively and has recently fallen. MD made aware of patient symptoms/complaints.

## 2021-03-02 NOTE — Progress Notes (Signed)
CT CODE STROKE  Call Time  1430 Beeper   None Exam Start  1400 Exam End  [address]   4276 RWPT   0034 Rad   Report already back.  CODE stroke called 30 minutes after CT head w/o ordered.

## 2021-03-02 NOTE — Consult Note (Addendum)
[facility] Telemedicine Consult   Requesting Provider: Gloris Manchester Consult Participants: Patient, Aliene Beams RN Location of the provider: [facility] Location of the patient: [facility]  This consult was provided via telemedicine with 2-way video and audio communication. The patient/family was informed that care would be provided in this way and agreed to receive care in this manner.    Chief Complaint: Slurred speech  HPI: Mr. Overton Mam is a 80 year old gentleman who last spoke with outside speech last night prior to bed.  This morning, he presented with slurred speech.  There was an unclear last known well and therefore code stroke was not initially activated.  He was taken for a head CT which did demonstrate a right basal ganglier stroke, and there was some question about time of onset and therefore a code stroke was activated.  After discussion with the patient, it sounds like his true last known well was last night prior to bed.    LKW: Last night tpa given?: No, out of window IR Thrombectomy? No, no LVO signs  Modified Rankin Scale: 0-Completely asymptomatic and back to baseline post- stroke Time of teleneurologist evaluation: 14:36  Exam: Vitals:   03/02/21 1330 03/02/21 1400  BP: (!) 151/78 (!) 156/79  Pulse: (!) 101 99  Resp: (!) 21 (!) 23  Temp:    SpO2: 96% 97%    General: In bed, NAD  1A: Level of Consciousness - 0 1B: Ask Month and Age - 2 1C: 'Blink Eyes' & 'Squeeze Hands' - 0 2: Test Horizontal Extraocular Movements - 0 3: Test Visual Fields - 0 4: Test Facial Palsy - 1 5A: Test Left Arm Motor Drift - 1 5B: Test Right Arm Motor Drift - 0 6A: Test Left Leg Motor Drift - 1 6B: Test Right Leg Motor Drift - 0 7: Test Limb Ataxia - 0 8: Test Sensation - 1 9: Test Language/Aphasia- 1 10: Test Dysarthria - 1 11: Test Extinction/Inattention - 0 NIHSS score: 7   Imaging Reviewed: CT head - subacute appearing R BG  stroke  Labs reviewed in epic and pertinent values follow: Cr 1.43 BG 195    Assessment: 80 year old male with what is very likely a small vessel basal ganglia stroke.  He appears to have some left-sided weakness as well as dysarthria, though the weakness is mild.  He will need admission for both physical therapy as well as secondary risk factor modification.  Recommendations:  - HgbA1c -goal will be less than seven - fasting lipid panel -more aggressive lipid therapy if his LDL is greater than 70 - MRI of the brain without contrast - Frequent neuro checks - Echocardiogram - Prophylactic therapy-Antiplatelet med: Aspirin 325mg  PO or 300mg  PR - Risk factor modification - Telemetry monitoring - PT consult, OT consult, Speech consult - Stroke team to follow   This patient is receiving care for possible acute neurological changes. There was 35 minutes of care by this provider at the time of service, including time for direct evaluation via telemedicine, review of medical records, imaging studies and discussion of findings with providers, the patient and/or family.  Roland Rack, MD [facility] [phone]  If 7pm- 7am, please page neurology on call as listed in [place].

## 2021-03-02 NOTE — ED Notes (Signed)
Multiple nursing staff members have been to room to attempt IV access for CT scan. No success at this time.

## 2021-03-02 NOTE — H&P (Signed)
History and Physical    [name] TOI:[id number] DOB: 1941/08/17 DOA: 03/02/2021  PCP: [name], MD   Patient coming from: Home  I have personally briefly reviewed patient's old medical records in [place]  Chief Complaint: AMS  HPI: [name] is a 80 y.o. male with medical history significant for tobacco and alcohol abuse.  Patient was sent to the ED with reports of slurred speech, and confusion.  Family also reported that patient drinks excessively, and had been falling a lot recently, also reported that patient had some mild left leg weakness. At the time of my evaluation, patient is awake alert and oriented to person place and time, answering most questions but not all, it is hard to tell if his speech is actually slurred as he is not verbalizing much, and tone of his voice is low.  He agrees he had abnormal speech, but is not aware of any confusion.  He drinks alcoholic beverages daily, about 1 pints of gin daily.  Last drink was yesterday afternoon at about 5 PM. He also reports a mild productive cough, but denies difficulty breathing.  Unable to reach patient's daughter contact number listed per EDP note- #[phone].  ED Course: On arrival to the ED, patient was heavily soiled.  T-max 99.4.  Heart rate 80s to 117, respiratory rate 18-28, blood pressure systolic 833A to 250N.  O2 sats greater than 95% on room air.  WBC 5.3.  Blood alcohol level less than 10.  Creatinine elevated 1.43.  CT shows new right caudate/basal ganglia infarct. Teleneurology was consulted, it was elicited that patient was last known well last night prior to bed.  CTA head recommended, full stroke work-up also.  Review of Systems: As per HPI all other systems reviewed and negative.  Past Medical History:  Diagnosis Date   ETOH abuse    Hypertension     Past Surgical History:  Procedure Laterality Date   COLONOSCOPY  about 7-8 yrs ago   COLONOSCOPY N/A 03/27/2013   Procedure:  COLONOSCOPY;  Surgeon: Daneil Dolin, MD;  Location: [facility];  Service: Endoscopy;  Laterality: N/A;  10;30   ESOPHAGOGASTRODUODENOSCOPY N/A 03/27/2013   Procedure: ESOPHAGOGASTRODUODENOSCOPY (EGD);  Surgeon: Daneil Dolin, MD;  Location: [facility];  Service: Endoscopy;  Laterality: N/A;   LEG SURGERY     left     reports that he has been smoking cigars. He has never used smokeless tobacco. He reports current alcohol use. He reports that he does not use drugs.  No Known Allergies  Family History  Problem Relation Age of Onset   Stroke Mother    Heart failure Father    Colon cancer Neg Hx    Liver disease Neg Hx    Ulcers Neg Hx     Prior to Admission medications   Medication Sig Start Date End Date Taking? Authorizing Provider  amoxicillin-clarithromycin-lansoprazole [facility]) combo pack Take by mouth 2 (two) times daily. Follow package directions. Patient not taking: No sig reported 04/06/13   Rourk, Cristopher Estimable, MD  losartan-hydrochlorothiazide (HYZAAR) 100-25 MG per tablet Take 1 tablet by mouth daily.  Patient not taking: Reported on 10/24/2020 01/17/13   [provider]  nabumetone (RELAFEN) 750 MG tablet Take 750 mg by mouth 2 (two) times daily.  Patient not taking: Reported on 10/24/2020 01/20/13   [provider]  ondansetron (ZOFRAN ODT) 4 MG disintegrating tablet 4mg  ODT q4 hours prn nausea/vomit Patient not taking: No  sig reported 12/31/18   Milton Ferguson, MD  pantoprazole (PROTONIX) 20 MG tablet Take 1 tablet (20 mg total) by mouth daily. Patient not taking: No sig reported 12/31/18   Milton Ferguson, MD  polyethylene glycol-electrolytes (TRILYTE) 420 G solution Take 4,000 mLs by mouth as directed. Patient not taking: Reported on 10/24/2020 03/14/13   Daneil Dolin, MD    Physical Exam: Vitals:   03/02/21 1430 03/02/21 1500 03/02/21 1530 03/02/21 1540  BP: (!) 166/80 (!) 151/69 (!) 172/78 (!) 172/78  Pulse: 98 97 97 97  Resp: (!) 21 (!) 22 (!) 28 (!)  28  Temp:      TempSrc:      SpO2: 98% 97% 95% 95%  Weight:      Height:        Constitutional: NAD, calm, comfortable Vitals:   03/02/21 1430 03/02/21 1500 03/02/21 1530 03/02/21 1540  BP: (!) 166/80 (!) 151/69 (!) 172/78 (!) 172/78  Pulse: 98 97 97 97  Resp: (!) 21 (!) 22 (!) 28 (!) 28  Temp:      TempSrc:      SpO2: 98% 97% 95% 95%  Weight:      Height:       Eyes: PERRL, lids and conjunctivae normal ENMT: Mucous membranes are markedly dry.  Neck: normal, supple, no masses, no thyromegaly Respiratory: clear to auscultation bilaterally, no wheezing, no crackles. Normal respiratory effort. No accessory muscle use.  Cardiovascular: Regular rate and rhythm, no murmurs / rubs / gallops. No extremity edema. 2+ pedal pulses.  Abdomen: no tenderness, no masses palpated. No hepatosplenomegaly. Bowel sounds positive.  Musculoskeletal: no clubbing / cyanosis. No joint deformity upper and lower extremities. Good ROM, no contractures. Normal muscle tone.  Skin: no rashes, lesions, ulcers. No induration Neurologic:  Neurological:     Mental Status: She is alert.     GCS: GCS eye subscore is 4. GCS verbal subscore is 5. GCS motor subscore is 6.     Comments: Mental Status:  Alert, oriented, thought content appropriate, able to give some history.  Not verbalizing a lot, and speaking in a low tone, but no apparent slurred speech.  Able to follow 2 step commands without difficulty.  Cranial Nerves:  II:  Peripheral visual fields grossly normal, pupils equal, round, reactive to light III,IV, VI: ptosis not present, extra-ocular motions intact bilaterally  V,VII: smile symmetric, eyebrows raise symmetric, facial light touch sensation equal VIII: hearing grossly normal to voice  X:  XI: bilateral shoulder shrug symmetric and strong XII: midline tongue extension without fassiculations Motor:  Normal tone.   5/5 strength in bilateral upper and lower extremities, sensation intact.    Cerebellar:   CV: distal pulses palpable throughout   Psychiatric: Normal judgment and insight.  Appears slightly lethargic otherwise oriented x 3. Normal mood.   Labs on Admission: I have personally reviewed following labs and imaging studies  CBC: Recent Labs  Lab 03/02/21 1345  WBC 5.3  NEUTROABS 4.1  HGB 15.8  HCT 49.2  MCV 95.3  PLT 161   Basic Metabolic Panel: Recent Labs  Lab 03/02/21 1345  NA 140  K 4.1  CL 102  CO2 20*  GLUCOSE 195*  BUN 26*  CREATININE 1.43*  CALCIUM 9.4   Liver Function Tests: Recent Labs  Lab 03/02/21 1345  AST 29  ALT 22  ALKPHOS 72  BILITOT 1.6*  PROT 7.9  ALBUMIN 4.0   Coagulation Profile: Recent Labs  Lab 03/02/21 1345  INR  1.2    Urine analysis:    Component Value Date/Time   COLORURINE YELLOW 12/30/2018 0231   APPEARANCEUR CLEAR 12/30/2018 0231   LABSPEC 1.026 12/30/2018 0231   PHURINE 6.0 12/30/2018 0231   GLUCOSEU NEGATIVE 12/30/2018 0231   HGBUR MODERATE (A) 12/30/2018 0231   BILIRUBINUR NEGATIVE 12/30/2018 0231   KETONESUR 80 (A) 12/30/2018 0231   PROTEINUR 100 (A) 12/30/2018 0231   NITRITE NEGATIVE 12/30/2018 0231   LEUKOCYTESUR NEGATIVE 12/30/2018 0231    Radiological Exams on Admission: DG Chest 1 View  Result Date: 03/02/2021 CLINICAL DATA:  Stroke-like symptoms. Altered mental status. Smoker. EXAM: CHEST  1 VIEW COMPARISON:  09/26/2011. FINDINGS: Midline trachea. Normal heart size. Atherosclerosis in the transverse aorta. No pleural effusion or pneumothorax. Basilar and peripheral predominant interstitial thickening is greater left than right. Given differences in technique, relatively similar to 2013. IMPRESSION: Left greater than right basilar predominant interstitial opacities are similar to 2013. Possibly related to chronic bronchitis/smoking. Left lower lobe pneumonia cannot be entirely excluded. Interstitial lung disease is possible, but the lung bases on the 12/31/2018 CT appear relatively  uninvolved. Electronically Signed   By: Abigail Miyamoto M.D.   On: 03/02/2021 14:44   CT Head Wo Contrast  Result Date: 03/02/2021 CLINICAL DATA:  80 year old male with altered mental status. EXAM: CT HEAD WITHOUT CONTRAST TECHNIQUE: Contiguous axial images were obtained from the base of the skull through the vertex without intravenous contrast. COMPARISON:  10/24/2020 CT FINDINGS: Brain: A new RIGHT caudate/basal ganglia infarct is noted since 10/24/2020. Atrophy, chronic small-vessel white matter ischemic changes and remote LEFT basal ganglia infarcts again noted. There is no evidence of hemorrhage, extra-axial collection, mass lesion, midline shift or hydrocephalus. Vascular: Carotid atherosclerotic calcifications are noted. Skull: Normal. Negative for fracture or focal lesion. Sinuses/Orbits: No acute finding. Other: None IMPRESSION: 1. New RIGHT caudate/basal ganglia infarct since 10/24/2020 - likely acute to subacute. No evidence of hemorrhage. 2. Atrophy, chronic small-vessel white matter ischemic changes and remote LEFT basal ganglia infarcts. Electronically Signed   By: Margarette Canada M.D.   On: 03/02/2021 14:22    EKG: Independently reviewed.  Sinus rhythm rate 99, QTc 447, LVH.  No significant change from prior.  Assessment/Plan Principal Problem:   Acute CVA (cerebrovascular accident) [facility]) Active Problems:   Alcohol abuse   Essential hypertension   Acute CVA-reported slurred speech, left-sided weakness.  LKN- last night. No focal deficits appreciated on exam.  New RIGHT caudate/basal ganglia infarct since 10/24/2020 - likely acute to subacute.  CTA head and neck-no large vessel occlusion, severe stenosis of the left A2 ACA, mild to moderate atherosclerosis and neck < 50%. -Code stroke called, patient outside stroke window, full stroke work-up recommended. -Lipid panel hemoglobin A1c -Echocardiogram -Physical therapy speech therapy -MRI brain -Frequent neurochecks -Aspirin 325 mg x 1,  cont 81 mg daily -Neurology consult in the morning -Start atorvastatin 40 mg daily -We will allow for permissive hypertension  Metabolic encephalopathy- patient appears very dehydrated, also in the setting of acute/subacute CVA and chronic alcohol abuse.  T-max 99.4.  Blood alcohol level less than 10.  Chest x-ray suggesting more of chronic bronchitis/smoking, butr left lower lobe pneumonia not entirely excluded. -UA pending -Antibiotics held for now, low threshold to treat for pneumonia if suspected -Hydrate -Check B12, folate level in a.m  AKI-creatinine 1.43, baseline 0.9.  In the setting of HCTZ use - 1.5 L bolus given, cont N/s 100cc/hr x 20 hrs  Alcohol abuse-drinks 1 pint of gin daily.  Last drink yesterday at about 5 PM.  Blood alcohol level less than 10. - CIWA PRN -Thiamine folate multivitamins -Check magnesium and phosphorus  Hypertension-150s to 170s -Will allow for permissive hypertension, hold losartan HCTZ   DVT prophylaxis: Lovenox Code Status: Full code Family Communication: Unable to reach family.  None listed on demographics.  Contact #[phone]- for patients daughter- Ms. Cobb- also unavailable. Disposition Plan: ~ 2 days Consults called: Neurology Admission status: Inpatient, telemetry  I certify that at the point of admission it is my clinical judgment that the patient will require inpatient hospital care spanning beyond 2 midnights from the point of admission due to high intensity of service, high risk for further deterioration and high frequency of surveillance required. The following factors support the patient status of inpatient:    Bethena Roys MD [facility]  03/02/2021, 7:58 PM

## 2021-03-03 ENCOUNTER — Inpatient Hospital Stay (HOSPITAL_COMMUNITY): Payer: Medicare Other

## 2021-03-03 ENCOUNTER — Telehealth: Payer: Self-pay | Admitting: *Deleted

## 2021-03-03 DIAGNOSIS — I6389 Other cerebral infarction: Secondary | ICD-10-CM | POA: Diagnosis not present

## 2021-03-03 DIAGNOSIS — I10 Essential (primary) hypertension: Secondary | ICD-10-CM

## 2021-03-03 LAB — URINALYSIS, ROUTINE W REFLEX MICROSCOPIC
Bilirubin Urine: NEGATIVE
Glucose, UA: 150 mg/dL — AB
Ketones, ur: NEGATIVE mg/dL
Leukocytes,Ua: NEGATIVE
Nitrite: NEGATIVE
Protein, ur: NEGATIVE mg/dL
Specific Gravity, Urine: 1.004 — ABNORMAL LOW (ref 1.005–1.030)
pH: 6 (ref 5.0–8.0)

## 2021-03-03 LAB — ECHOCARDIOGRAM COMPLETE
AR max vel: 4.24 cm2
AV Area VTI: 5.13 cm2
AV Area mean vel: 3.61 cm2
AV Mean grad: 3.9 mmHg
AV Peak grad: 6.8 mmHg
Ao pk vel: 1.3 m/s
Area-P 1/2: 3.61 cm2
Height: 70 in
S' Lateral: 2.1 cm
Single Plane A4C EF: 40.4 %
Weight: 2240 oz

## 2021-03-03 LAB — CBC
HCT: 43.6 % (ref 39.0–52.0)
Hemoglobin: 14.4 g/dL (ref 13.0–17.0)
MCH: 30.6 pg (ref 26.0–34.0)
MCHC: 33 g/dL (ref 30.0–36.0)
MCV: 92.6 fL (ref 80.0–100.0)
Platelets: 228 10*3/uL (ref 150–400)
RBC: 4.71 MIL/uL (ref 4.22–5.81)
RDW: 14.9 % (ref 11.5–15.5)
WBC: 6.8 10*3/uL (ref 4.0–10.5)
nRBC: 0 % (ref 0.0–0.2)

## 2021-03-03 LAB — BASIC METABOLIC PANEL
Anion gap: 6 (ref 5–15)
BUN: 20 mg/dL (ref 8–23)
CO2: 23 mmol/L (ref 22–32)
Calcium: 8.6 mg/dL — ABNORMAL LOW (ref 8.9–10.3)
Chloride: 110 mmol/L (ref 98–111)
Creatinine, Ser: 1.05 mg/dL (ref 0.61–1.24)
GFR, Estimated: >60 mL/min (ref >60)
Glucose, Bld: 162 mg/dL — ABNORMAL HIGH (ref 70–99)
Potassium: 3.5 mmol/L (ref 3.5–5.1)
Sodium: 139 mmol/L (ref 135–145)

## 2021-03-03 LAB — RAPID URINE DRUG SCREEN, HOSP PERFORMED
Amphetamines: NOT DETECTED
Barbiturates: NOT DETECTED
Benzodiazepines: NOT DETECTED
Cocaine: NOT DETECTED
Opiates: NOT DETECTED
Tetrahydrocannabinol: NOT DETECTED

## 2021-03-03 LAB — HEMOGLOBIN A1C
Hgb A1c MFr Bld: 5.6 % (ref 4.8–5.6)
Mean Plasma Glucose: 114.02 mg/dL

## 2021-03-03 LAB — LIPID PANEL
Cholesterol: 119 mg/dL (ref 0–200)
HDL: 50 mg/dL (ref >40)
LDL Cholesterol: 42 mg/dL (ref 0–99)
Total CHOL/HDL Ratio: 2.4 RATIO
Triglycerides: 136 mg/dL (ref <150)
VLDL: 27 mg/dL (ref 0–40)

## 2021-03-03 LAB — MAGNESIUM: Magnesium: 2.3 mg/dL (ref 1.7–2.4)

## 2021-03-03 LAB — VITAMIN B12: Vitamin B-12: 623 pg/mL (ref 180–914)

## 2021-03-03 LAB — FOLATE: Folate: 4.2 ng/mL — ABNORMAL LOW (ref >5.9)

## 2021-03-03 MED ORDER — SODIUM CHLORIDE 0.9 % IV SOLN: 1.0000 mg | Freq: Every day | INTRAVENOUS | Status: DC

## 2021-03-03 MED ORDER — SODIUM CHLORIDE 0.9 % IV SOLN
INTRAVENOUS | Status: DC
  Administered 2021-03-04: 1000 mL via INTRAVENOUS

## 2021-03-03 MED ORDER — FOLIC ACID 5 MG/ML IJ SOLN
1.0000 mg | Freq: Every day | INTRAMUSCULAR | Status: DC
  Administered 2021-03-03 – 2021-03-06 (×4): 1 mg via INTRAVENOUS
  Filled 2021-03-03 (×4): qty 0.2

## 2021-03-03 MED ORDER — ASPIRIN 300 MG RE SUPP
150.0000 mg | Freq: Every day | RECTAL | Status: DC
  Administered 2021-03-03: 150 mg via RECTAL

## 2021-03-03 NOTE — Plan of Care (Addendum)
  Problem: Acute Rehab PT Goals(only PT should resolve) Goal: Pt Will Go Supine/Side To Sit Outcome: Progressing Flowsheets (Taken 03/03/2021 1102) Pt will go Supine/Side to Sit: with modified independence Goal: Patient Will Perform Sitting Balance Outcome: Progressing Flowsheets (Taken 03/03/2021 1102) Patient will perform sitting balance:  with modified independence  3- 5 min  with bilateral UE support Goal: Patient Will Transfer Sit To/From Stand Outcome: Progressing Flowsheets (Taken 03/03/2021 1102) Patient will transfer sit to/from stand:  with minimal assist  with moderate assist Goal: Pt Will Transfer Bed To Chair/Chair To Bed Outcome: Progressing Flowsheets (Taken 03/03/2021 1102) Pt will Transfer Bed to Chair/Chair to Bed: with min assist Goal: Pt Will Ambulate Outcome: Progressing Flowsheets (Taken 03/03/2021 1102) Pt will Ambulate:  15 feet  with min guard assist  with rolling walker    11:03 AM, 03/03/21 Sinclair Ship SPT  11:23 AM, 03/03/21 Lonell Grandchild, MPT Physical Therapist with [facility] [phone] office [phone] mobile phone

## 2021-03-03 NOTE — Progress Notes (Signed)
PROGRESS NOTE   [name]  YNW:[id number] DOB: 17-Aug-1941 DOA: 03/02/2021 PCP: Gareth Morgan, MD   Chief Complaint  Patient presents with   Altered Mental Status   Level of care: Telemetry  Brief Admission History:  80 y.o. male with medical history significant for tobacco and alcohol abuse.  Patient was sent to the ED with reports of slurred speech, and confusion.  Family also reported that patient drinks excessively, and had been falling a lot recently, also reported that patient had some mild left leg weakness.  At the time of my evaluation, patient is awake alert and oriented to person place and time, answering most questions but not all, it is hard to tell if his speech is actually slurred as he is not verbalizing much, and tone of his voice is low.  He agrees he had abnormal speech, but is not aware of any confusion.  He drinks alcoholic beverages daily, about 1 pints of gin daily.  Last drink was yesterday afternoon at about 5 PM. He also reports a mild productive cough, but denies difficulty breathing.  Assessment & Plan:   Principal Problem:   Acute CVA (cerebrovascular accident) [facility]) Active Problems:   Alcohol abuse   Essential hypertension  Acute CVA - bilateral anterior basal ganglia region and left superior frontal gyrus with other acute/subacute infarcts suggesting embolic event - no known history of Afib.  Neurology on site not available at [facility] today.  I have spoken with covering neurologist Dr. Wilford Corner at [facility].  He reviewed patient's case with me including images, meds and labs and echo.  Recommendation was for patient to continue aspirin 81 mg daily.  Statin not recommended at this time due to LDL being 42 and he was on no prior statin.  He recommended a 30 day event monitor. I have called cardiology office to make arrangements for a 30 day monitor. Pt will need to follow up with Dr. Gerilyn Pilgrim outpatient.  Completing stroke work up in hospital. He likely will need  SNF rehab.    Alcohol abuse - Pt is on strict CIWA withdrawal protocol.  He is receiving thiamine, multivitamin and folic acid.    Dysphagia - I spoke with speech therapist and they will perform a swallowing evaluation and likely a barium swallow test.    Essential hypertension - permissive hypertension, no BP elevations seen on no meds at this time.   Metabolic encephalopathy - secondary to acute CVA, slowly improving with supportive care.    AKI - improving  with IV fluid hydration.   DVT prophylaxis: lovenox  Code Status: full  Family Communication: I tried to reach brother and daughter and no answer from either phone and not able to leave msg.  Disposition: anticipating SNF placement  Status is: Inpatient  Remains inpatient appropriate because:Inpatient level of care appropriate due to severity of illness  Dispo: The patient is from: Home              Anticipated d/c is to: SNF              Patient currently is not medically stable to d/c.   Difficult to place patient No       Consultants:  Neurology telephone Speech therapy OT PT  Procedures:  MRI brain   Antimicrobials:  N/a    Subjective: Pt confused about date but knows he is in hospital.  He is having difficult swallowing.    Objective: Vitals:   03/03/21 0158 03/03/21 0207 03/03/21  0404 03/03/21 0758  BP: 98/81 138/60 (!) 148/63 132/62  Pulse: 85 85 84 90  Resp: 18  18 18   Temp: (!) 97.4 F (36.3 C)  97.7 F (36.5 C) 98.7 F (37.1 C)  TempSrc: Oral  Oral Oral  SpO2: 100%  92% 94%  Weight:      Height:        Intake/Output Summary (Last 24 hours) at 03/03/2021 1315 Last data filed at 03/03/2021 0900 Gross per 24 hour  Intake 2581.57 ml  Output 500 ml  Net 2081.57 ml   Filed Weights   03/02/21 1311  Weight: 63.5 kg    Examination:  General exam: severe dental caries, emaciated and poorly groomed, Appears calm and comfortable  Respiratory system: Clear to auscultation. Respiratory effort  normal. Cardiovascular system: normal S1 & S2 heard. No JVD, murmurs, rubs, gallops or clicks. No pedal edema. Gastrointestinal system: Abdomen is nondistended, soft and nontender. No organomegaly or masses felt. Normal bowel sounds heard. Central nervous system: left UE 3-4/5 strength, LLE 4/5, RUE, RLE 5/5.  Extremities: no cyanosis or clubbing seen.  Skin: No rashes, lesions or ulcers Psychiatry: Judgement and insight appear poor. Mood & affect appropriate.   Data Reviewed: I have personally reviewed following labs and imaging studies  CBC: Recent Labs  Lab 03/02/21 1345 03/03/21 0327  WBC 5.3 6.8  NEUTROABS 4.1  --   HGB 15.8 14.4  HCT 49.2 43.6  MCV 95.3 92.6  PLT 227 228    Basic Metabolic Panel: Recent Labs  Lab 03/02/21 1345 03/02/21 1603 03/03/21 0327  NA 140  --  139  K 4.1  --  3.5  CL 102  --  110  CO2 20*  --  23  GLUCOSE 195*  --  162*  BUN 26*  --  20  CREATININE 1.43*  --  1.05  CALCIUM 9.4  --  8.6*  MG  --  1.5* 2.3  PHOS  --  2.2*  --     GFR: Estimated Creatinine Clearance: 51.2 mL/min (by C-G formula based on SCr of 1.05 mg/dL).  Liver Function Tests: Recent Labs  Lab 03/02/21 1345  AST 29  ALT 22  ALKPHOS 72  BILITOT 1.6*  PROT 7.9  ALBUMIN 4.0    CBG: No results for input(s): GLUCAP in the last 168 hours.  Recent Results (from the past 240 hour(s))  Resp Panel by RT-PCR (Flu A&B, Covid) Nasopharyngeal Swab     Status: None   Collection Time: 03/02/21  2:29 PM   Specimen: Nasopharyngeal Swab; Nasopharyngeal(NP) swabs in vial transport medium  Result Value Ref Range Status   SARS Coronavirus 2 by RT PCR NEGATIVE NEGATIVE Final    Comment: (NOTE) SARS-CoV-2 target nucleic acids are NOT DETECTED.  The SARS-CoV-2 RNA is generally detectable in upper respiratory specimens during the acute phase of infection. The lowest concentration of SARS-CoV-2 viral copies this assay can detect is 138 copies/mL. A negative result does not  preclude SARS-Cov-2 infection and should not be used as the sole basis for treatment or other patient management decisions. A negative result may occur with  improper specimen collection/handling, submission of specimen other than nasopharyngeal swab, presence of viral mutation(s) within the areas targeted by this assay, and inadequate number of viral copies(<138 copies/mL). A negative result must be combined with clinical observations, patient history, and epidemiological information. The expected result is Negative.  Fact Sheet for Patients:  BloggerCourse.com  Fact Sheet for Healthcare Providers:  SeriousBroker.it  This test is no t yet approved or cleared by the [place] and  has been authorized for detection and/or diagnosis of SARS-CoV-2 by FDA under an Emergency Use Authorization (EUA). This EUA will remain  in effect (meaning this test can be used) for the duration of the COVID-19 declaration under Section 564(b)(1) of the Act, 21 U.S.C.section 360bbb-3(b)(1), unless the authorization is terminated  or revoked sooner.       Influenza A by PCR NEGATIVE NEGATIVE Final   Influenza B by PCR NEGATIVE NEGATIVE Final    Comment: (NOTE) The Xpert Xpress SARS-CoV-2/FLU/RSV plus assay is intended as an aid in the diagnosis of influenza from Nasopharyngeal swab specimens and should not be used as a sole basis for treatment. Nasal washings and aspirates are unacceptable for Xpert Xpress SARS-CoV-2/FLU/RSV testing.  Fact Sheet for Patients: BloggerCourse.com  Fact Sheet for Healthcare Providers: SeriousBroker.it  This test is not yet approved or cleared by the [place] FDA and has been authorized for detection and/or diagnosis of SARS-CoV-2 by FDA under an Emergency Use Authorization (EUA). This EUA will remain in effect (meaning this test can be used) for the  duration of the COVID-19 declaration under Section 564(b)(1) of the Act, 21 U.S.C. section 360bbb-3(b)(1), unless the authorization is terminated or revoked.  Performed at [facility], [address] [place], [place] [address]      Radiology Studies: DG Chest 1 View  Result Date: 03/02/2021 CLINICAL DATA:  Stroke-like symptoms. Altered mental status. Smoker. EXAM: CHEST  1 VIEW COMPARISON:  09/26/2011. FINDINGS: Midline trachea. Normal heart size. Atherosclerosis in the transverse aorta. No pleural effusion or pneumothorax. Basilar and peripheral predominant interstitial thickening is greater left than right. Given differences in technique, relatively similar to 2013. IMPRESSION: Left greater than right basilar predominant interstitial opacities are similar to 2013. Possibly related to chronic bronchitis/smoking. Left lower lobe pneumonia cannot be entirely excluded. Interstitial lung disease is possible, but the lung bases on the 12/31/2018 CT appear relatively uninvolved. Electronically Signed   By: Jeronimo Greaves M.D.   On: 03/02/2021 14:44   CT Head Wo Contrast  Result Date: 03/02/2021 CLINICAL DATA:  80 year old male with altered mental status. EXAM: CT HEAD WITHOUT CONTRAST TECHNIQUE: Contiguous axial images were obtained from the base of the skull through the vertex without intravenous contrast. COMPARISON:  10/24/2020 CT FINDINGS: Brain: A new RIGHT caudate/basal ganglia infarct is noted since 10/24/2020. Atrophy, chronic small-vessel white matter ischemic changes and remote LEFT basal ganglia infarcts again noted. There is no evidence of hemorrhage, extra-axial collection, mass lesion, midline shift or hydrocephalus. Vascular: Carotid atherosclerotic calcifications are noted. Skull: Normal. Negative for fracture or focal lesion. Sinuses/Orbits: No acute finding. Other: None IMPRESSION: 1. New RIGHT caudate/basal ganglia infarct since 10/24/2020 - likely acute to subacute. No evidence of  hemorrhage. 2. Atrophy, chronic small-vessel white matter ischemic changes and remote LEFT basal ganglia infarcts. Electronically Signed   By: Harmon Pier M.D.   On: 03/02/2021 14:22   MR BRAIN WO CONTRAST  Result Date: 03/03/2021 CLINICAL DATA:  Stroke follow-up. Slurred speech, left-sided weakness. EXAM: MRI HEAD WITHOUT CONTRAST TECHNIQUE: Multiplanar, multiecho pulse sequences of the brain and surrounding structures were obtained without intravenous contrast. COMPARISON:  Head CT March 02, 2021. FINDINGS: Brain: Foci of restricted diffusion are seen in the bilateral anterior basal ganglia region, measuring approximately 18 mm on the right and 8 mm on the left, consistent with acute/subacute infarct. A punctate focus of restricted diffusion is also seen in  the left superior frontal gyrus. No hemorrhage, hydrocephalus, extra-axial collection or mass lesion. Small remote infarcts are seen in the left cerebellar hemisphere, thalami, bilateral basal ganglia region, left corona radiata and right centrum semiovale. Scattered and confluent foci of T2 hyperintensity are seen within the white matter of the cerebral hemispheres, nonspecific, most likely related to chronic small vessel ischemia. Moderate parenchymal volume loss. Vascular: Normal flow voids. Skull and upper cervical spine: Normal marrow signal. Sinuses/Orbits: Negative. Other: None. IMPRESSION: 1. Bilateral anterior basal ganglia region and left superior frontal gyrus acute/subacute infarcts. 2. Remote small infarcts in the left cerebellar hemisphere, thalami, bilateral basal ganglia region, left corona radiata and right centrum semiovale. 3. Mild chronic microvascular ischemic changes of the white matter and moderate parenchymal volume loss. Electronically Signed   By: Baldemar Lenis M.D.   On: 03/03/2021 12:35   ECHOCARDIOGRAM COMPLETE  Result Date: 03/03/2021    ECHOCARDIOGRAM REPORT   Patient Name:   [name] Date of Exam:  03/03/2021 Medical Rec #:  [id number]      Height:       70.0 in Accession #:    [id number]     Weight:       140.0 lb Date of Birth:  1941/08/04       BSA:          1.794 m Patient Age:    80 years       BP:           132/62 mmHg Patient Gender: M              HR:           90 bpm. [facility] Procedure: 2D Echo, Cardiac Doppler and Color Doppler Indications:    CVA  History:        Patient has no prior history of Echocardiogram examinations.                 Risk Factors:Current Smoker and Hypertension. Alcohol abuse.  Sonographer:    Lavenia Atlas RDCS Referring Phys: [phone] Heloise Beecham [facility] IMPRESSIONS  1. Left ventricular ejection fraction, by estimation, is 60 to 65%. The left ventricle has normal function. The left ventricle has no regional wall motion abnormalities. There is mild left ventricular hypertrophy. Left ventricular diastolic parameters are consistent with Grade I diastolic dysfunction (impaired relaxation).  2. Right ventricular systolic function is normal. The right ventricular size is normal. Tricuspid regurgitation signal is inadequate for assessing PA pressure.  3. The mitral valve is normal in structure. No evidence of mitral valve regurgitation. No evidence of mitral stenosis.  4. The aortic valve is tricuspid. Aortic valve regurgitation is not visualized. No aortic stenosis is present.  5. The inferior vena cava is normal in size with greater than 50% respiratory variability, suggesting right atrial pressure of 3 mmHg. FINDINGS  Left Ventricle: Left ventricular ejection fraction, by estimation, is 60 to 65%. The left ventricle has normal function. The left ventricle has no regional wall motion abnormalities. The left ventricular internal cavity size was normal in size. There is  mild left ventricular hypertrophy. Left ventricular diastolic parameters are consistent with Grade I diastolic dysfunction (impaired relaxation). Normal left ventricular filling pressure. Right  Ventricle: The right ventricular size is normal. No increase in right ventricular wall thickness. Right ventricular systolic function is normal. Tricuspid regurgitation signal is inadequate for assessing PA pressure. Left Atrium: Left atrial size was normal in size. Right Atrium: Right  atrial size was normal in size. Pericardium: There is no evidence of pericardial effusion. Mitral Valve: The mitral valve is normal in structure. No evidence of mitral valve regurgitation. No evidence of mitral valve stenosis. Tricuspid Valve: The tricuspid valve is normal in structure. Tricuspid valve regurgitation is not demonstrated. No evidence of tricuspid stenosis. Aortic Valve: The aortic valve is tricuspid. Aortic valve regurgitation is not visualized. No aortic stenosis is present. Aortic valve mean gradient measures 3.9 mmHg. Aortic valve peak gradient measures 6.8 mmHg. Aortic valve area, by VTI measures 5.13 cm. Pulmonic Valve: The pulmonic valve was not well visualized. Pulmonic valve regurgitation is not visualized. No evidence of pulmonic stenosis. Aorta: The aortic root is normal in size and structure. Venous: The inferior vena cava is normal in size with greater than 50% respiratory variability, suggesting right atrial pressure of 3 mmHg. IAS/Shunts: No atrial level shunt detected by color flow Doppler.  LEFT VENTRICLE PLAX 2D LVIDd:         3.30 cm     Diastology LVIDs:         2.10 cm     LV e' medial:    3.48 cm/s LV PW:         1.30 cm     LV E/e' medial:  14.2 LV IVS:        1.20 cm     LV e' lateral:   5.33 cm/s LVOT diam:     2.40 cm     LV E/e' lateral: 9.2 LV SV:         91 LV SV Index:   51 LVOT Area:     4.52 cm  LV Volumes (MOD) LV vol d, MOD A4C: 68.3 ml LV vol s, MOD A4C: 40.7 ml LV SV MOD A4C:     68.3 ml RIGHT VENTRICLE RV Basal diam:  2.30 cm RV S prime:     14.35 cm/s TAPSE (M-mode): 1.6 cm LEFT ATRIUM             Index       RIGHT ATRIUM           Index LA diam:        3.20 cm 1.78 cm/m  RA Area:      13.40 cm LA Vol (A2C):   45.1 ml 25.14 ml/m RA Volume:   34.90 ml  19.46 ml/m LA Vol (A4C):   34.3 ml 19.12 ml/m LA Biplane Vol: 40.5 ml 22.58 ml/m  AORTIC VALVE AV Area (Vmax):    4.24 cm AV Area (Vmean):   3.61 cm AV Area (VTI):     5.13 cm AV Vmax:           130.31 cm/s AV Vmean:          93.659 cm/s AV VTI:            0.178 m AV Peak Grad:      6.8 mmHg AV Mean Grad:      3.9 mmHg LVOT Vmax:         122.00 cm/s LVOT Vmean:        74.800 cm/s LVOT VTI:          0.202 m LVOT/AV VTI ratio: 1.13  AORTA Ao Root diam: 2.70 cm MITRAL VALVE MV Area (PHT): 3.61 cm    SHUNTS MV Decel Time: 210 msec    Systemic VTI:  0.20 m MV E velocity: 49.30 cm/s  Systemic Diam: 2.40 cm MV A velocity: 77.10 cm/s  MV E/A ratio:  0.64 Dina Rich MD Electronically signed by Dina Rich MD Signature Date/Time: 03/03/2021/9:14:07 AM    Final    CT ANGIO HEAD NECK W WO CM (CODE STROKE)  Result Date: 03/02/2021 CLINICAL DATA:  Stroke follow-up. EXAM: CT ANGIOGRAPHY HEAD AND NECK TECHNIQUE: Multidetector CT imaging of the head and neck was performed using the standard protocol during bolus administration of intravenous contrast. Multiplanar CT image reconstructions and MIPs were obtained to evaluate the vascular anatomy. Carotid stenosis measurements (when applicable) are obtained utilizing NASCET criteria, using the distal internal carotid diameter as the denominator. CONTRAST:  75mL OMNIPAQUE IOHEXOL 350 MG/ML SOLN COMPARISON:  Same day CT head. FINDINGS: CTA NECK FINDINGS Aortic arch: Calcific atherosclerosis of the aorta. Great vessel origins are patent. Right carotid system: Mild atherosclerosis at the carotid bifurcation without greater than 50% stenosis. Tortuous ICA. Left carotid system: Mild-to-moderate atherosclerosis at the carotid bifurcation without greater than 50% stenosis. Vertebral arteries: Right dominant. Tortuous vertebral arteries bilaterally. Mild atherosclerosis of the proximal right vertebral  artery without greater than 50% stenosis. Skeleton: Moderate to severe multilevel degenerative change of the cervical spine, including disc height loss and endplate spurring. Other neck: No acute abnormality. Upper chest: Similar biapical pleuroparenchymal scarring in comparison to remote 2013 prior. In addition, there is an irregular 6 mm pulmonary nodule in the right upper lung with small calcification in an area that was previously not imaged. Review of the MIP images confirms the above findings CTA HEAD FINDINGS Anterior circulation: Bilateral intracranial ICA calcific atherosclerosis without evidence of greater than 50% stenosis. Bilateral MCAs are patent without proximal flow limiting stenosis. Small right A1 ACA. Bilateral ACAs are patent with severe stenosis of the left A2 ACA (series 7, image 106; series 6, image 281). No aneurysm identified. Posterior circulation: Moderate stenosis of the non dominant left intradural vertebral artery. Dominant right vertebral artery and basilar artery are patent without flow-limiting stenosis. Small right P1 PCA with prominent right posterior communicating artery, anatomic variant. Bilateral PCAs are patent without proximal flow limiting stenosis. Venous sinuses: Limited evaluation due to arterial timing without specific evidence of dural venous sinus thrombosis. Anatomic variants: See above. Review of the MIP images confirms the above findings IMPRESSION: CTA head: 1. No large vessel occlusion. 2. Severe stenosis of the left A2 ACA. 3. Moderate stenosis of the non dominant left intradural vertebral artery. CTA Neck: 1. Mild to moderate atherosclerosis in the neck without greater than 50% stenosis. 2. Approximately 6 mm irregular/linear pulmonary nodule in the right upper lung with small calcification. While this could potentially represent area of scarring, a non-contrast chest CT at 6-12 months is recommended to ensure stability. If the nodule is stable at time of repeat  CT, then future CT at 18-24 months (from today's scan) is considered optional for low-risk patients, but is recommended for high-risk patients. This recommendation follows the consensus statement: Guidelines for Management of Incidental Pulmonary Nodules Detected on CT Images: From the Fleischner Society 2017; Radiology 2017; 284:228-243. Electronically Signed   By: Feliberto Harts MD   On: 03/02/2021 17:05    Scheduled Meds:  aspirin EC  81 mg Oral Daily   atorvastatin  40 mg Oral Daily   enoxaparin (LOVENOX) injection  40 mg Subcutaneous Q24H   folic acid  1 mg Oral Daily   multivitamin with minerals  1 tablet Oral Daily   thiamine  100 mg Oral Daily   Or   thiamine  100 mg Intravenous Daily  Continuous Infusions:  sodium chloride 100 mL/hr (03/02/21 2030)     LOS: 1 day   Time spent: 35 mins  Kamyla Olejnik Laural Benes, MD How to contact the [facility] Attending or Consulting provider 7A - 7P or covering provider during after hours 7P -7A, for this patient?  Check the care team in [facility] and look for a) attending/consulting [facility] provider listed and b) the [facility] team listed Log into [email or web address] and use [place]'s universal password to access. If you do not have the password, please contact the hospital operator. Locate the [facility] provider you are looking for under [facility] and page to a number that you can be directly reached. If you still have difficulty reaching the provider, please page the [facility] (Director on Call) for the Hospitalists listed on amion for assistance.  03/03/2021, 1:15 PM

## 2021-03-03 NOTE — Telephone Encounter (Signed)
hospitalist called dr Wynetta Emery [phone] needs a 30 day event monitor for [id number] dx stroke  Pt enrolled in [employer].

## 2021-03-03 NOTE — Progress Notes (Signed)
Pt's NIHSS stroke assessment completed, changes noted from previously documented assessment. MD Wynetta Emery in room to see pt, updated on current findings, specifically difficulty swallowing this am, congested cough with need for oral suctioning to remove oral secretions, inability to hold left arm up (no resistance to gravity) and visual loss left side. MD states waiting for MRI results to decide further steps. Pt awake, oriented x3, follows most command, speech garbled. Oral care performed, pt's tongue dry and black with strong odor of old cigarettes. No teeth upper, missing and broken teeth lower with poor dental hygiene. Pt states does not have dentures.

## 2021-03-03 NOTE — Progress Notes (Signed)
Pt previously passed bedside swallow screen.  This AM, pt asked for water and upon giving patient water he held water in his mouth for extended period of time and had to be prompted to tuck chin and swallow.  Pt did not cough afterwards, but did clear throat.  SLP will see patient today, so NPO order placed for patient safety and MD to be made aware.

## 2021-03-03 NOTE — Evaluation (Addendum)
Physical Therapy Evaluation Patient Details Name: [name] MRN: [id number] DOB: 1941/03/30 Today's Date: 03/03/2021   History of Present Illness  [name] is a 80 y.o. male with medical history significant for tobacco and alcohol abuse.  Patient was sent to the ED with reports of slurred speech, and confusion.  Family also reported that patient drinks excessively, and had been falling a lot recently, also reported that patient had some mild left leg weakness.  At the time of my evaluation, patient is awake alert and oriented to person place and time, answering most questions but not all, it is hard to tell if his speech is actually slurred as he is not verbalizing much, and tone of his voice is low.  He agrees he had abnormal speech, but is not aware of any confusion.  He drinks alcoholic beverages daily, about 1 pints of gin daily.  Last drink was yesterday afternoon at about 5 PM.  He also reports a mild productive cough, but denies difficulty breathing.   Clinical Impression  Patient presents sitting up in bed and agreeable to therapy. Patient required mod assisted for bed mobility and transfer to sitting at EOB. Patient demonstrates poor seated balance required hand held assist to maintain neutral up right position. Patient required mod assist for sit to stand with verbal and tactile cues on hand/foot placement while using a RW. During transfer patient was able to follow directions and with mod assist ambulate 5 feet at the bed site with verbal and tactile cues. Patient tolerated sitting in chair well - nursing staff communicated mobility status. Patient will benefit from continued physical therapy in hospital and recommended venue below to increase strength, balance, endurance for safe ADLs and gait.     Follow Up Recommendations SNF    Equipment Recommendations  None recommended by PT    Recommendations for Other Services       Precautions / Restrictions  Precautions Precautions: Fall Restrictions Weight Bearing Restrictions: No      Mobility  Bed Mobility Overal bed mobility: Needs Assistance Bed Mobility: Supine to Sit     Supine to sit: Mod assist       Patient Response: Flat affect;Cooperative  Transfers Overall transfer level: Needs assistance Equipment used: Rolling walker (2 wheeled) Transfers: Sit to/from [facility] Sit to Stand: Mod assist Stand pivot transfers: Mod assist       General transfer comment: using RW  Ambulation/Gait Ambulation/Gait assistance: Mod assist   Assistive device: Rolling walker (2 wheeled) Gait Pattern/deviations: Decreased step length - left;Decreased step length - right;Decreased stride length;Trunk flexed;Narrow base of support Gait velocity: decreased   General Gait Details: slow labored movements, increase time, required verbal and tactile cues  Stairs            Wheelchair Mobility    Modified Rankin (Stroke Patients Only)       Balance Overall balance assessment: Needs assistance Sitting-balance support: Feet unsupported;Bilateral upper extremity supported Sitting balance-Leahy Scale: Poor Sitting balance - Comments: at EOB   Standing balance support: Bilateral upper extremity supported;During functional activity Standing balance-Leahy Scale: Poor Standing balance comment: using RW                             Pertinent Vitals/Pain Pain Assessment: No/denies pain    Home Living Family/patient expects to be discharged to:: Private residence Living Arrangements: Alone Available Help at Discharge: Family;Available PRN/intermittently Type of Home: [place]  Access: Stairs to enter Entrance Stairs-Rails: Can reach both;Left;Right Entrance Stairs-Number of Steps: 4 Home Layout: Two level;Able to live on main level with bedroom/bathroom Home Equipment: Gilford Rile - 2 wheels;Cane - single point;Grab bars - tub/shower Additional  Comments: patient is a poor historian    Prior Function Level of Independence: Independent with assistive device(s)         Comments: household amblator with RW     Hand Dominance        Extremity/Trunk Assessment   Upper Extremity Assessment Upper Extremity Assessment: Generalized weakness    Lower Extremity Assessment Lower Extremity Assessment: Generalized weakness    Cervical / Trunk Assessment Cervical / Trunk Assessment: Kyphotic  Communication   Communication: No difficulties  Cognition Arousal/Alertness: Awake/alert Behavior During Therapy: WFL for tasks assessed/performed Overall Cognitive Status: No family/caregiver present to determine baseline cognitive functioning                                 General Comments: slow to respond with slightly slurred speach      General Comments      Exercises     Assessment/Plan    PT Assessment Patient needs continued PT services  PT Problem List Decreased strength;Decreased activity tolerance;Decreased balance;Decreased mobility;Decreased coordination;Decreased safety awareness       PT Treatment Interventions DME instruction;Gait training;Stair training;Therapeutic exercise;Therapeutic activities;Functional mobility training;Balance training;Patient/family education    PT Goals (Current goals can be found in the Care Plan section)  Acute Rehab PT Goals Patient Stated Goal: return home PT Goal Formulation: With patient Time For Goal Achievement: 03/17/21 Potential to Achieve Goals: Good    Frequency Min 3X/week   Barriers to discharge        Co-evaluation               AM-PAC PT "6 Clicks" Mobility  Outcome Measure Help needed turning from your back to your side while in a flat bed without using bedrails?: A Little Help needed moving from lying on your back to sitting on the side of a flat bed without using bedrails?: A Little Help needed moving to and from a bed to a chair  (including a wheelchair)?: A Lot Help needed standing up from a chair using your arms (e.g., wheelchair or bedside chair)?: A Lot Help needed to walk in hospital room?: A Lot Help needed climbing 3-5 steps with a railing? : Total 6 Click Score: 13    End of Session   Activity Tolerance: Patient tolerated treatment well;Patient limited by fatigue;Patient limited by lethargy Patient left: in chair;with call bell/phone within reach;with chair alarm set Nurse Communication: Mobility status PT Visit Diagnosis: Unsteadiness on feet (R26.81);Other abnormalities of gait and mobility (R26.89);Muscle weakness (generalized) (M62.81);Difficulty in walking, not elsewhere classified (R26.2)    Time: 2947-6546 PT Time Calculation (min) (ACUTE ONLY): 20 min   Charges:   PT Evaluation $PT Eval Moderate Complexity: 1 Mod PT Treatments $Therapeutic Activity: 8-22 mins   11:25 AM, 03/03/21 Jeneen Rinks Cousler SPT  11:25 AM, 03/03/21 Lonell Grandchild, MPT Physical Therapist with [facility] 336 [phone] office [phone] mobile phone

## 2021-03-03 NOTE — Progress Notes (Signed)
  Echocardiogram 2D Echocardiogram has been performed.  [name] 03/03/2021, 8:48 AM

## 2021-03-03 NOTE — Evaluation (Signed)
Clinical/Bedside Swallow Evaluation Patient Details  Name: [name] MRN: [id number] Date of Birth: 1941-05-19  Today's Date: 03/03/2021 Time: SLP Start Time (ACUTE ONLY): 1502 SLP Stop Time (ACUTE ONLY): 1528 SLP Time Calculation (min) (ACUTE ONLY): 26 min  Past Medical History:  Past Medical History:  Diagnosis Date   ETOH abuse    Hypertension    Past Surgical History:  Past Surgical History:  Procedure Laterality Date   COLONOSCOPY  about 7-8 yrs ago   COLONOSCOPY N/A 03/27/2013   Procedure: COLONOSCOPY;  Surgeon: Corbin Ade, MD;  Location: [facility];  Service: Endoscopy;  Laterality: N/A;  10;30   ESOPHAGOGASTRODUODENOSCOPY N/A 03/27/2013   Procedure: ESOPHAGOGASTRODUODENOSCOPY (EGD);  Surgeon: Corbin Ade, MD;  Location: [facility];  Service: Endoscopy;  Laterality: N/A;   LEG SURGERY     left   HPI:  80 y.o. male with medical history significant for tobacco and alcohol abuse.  Patient was sent to the ED with reports of slurred speech, and confusion.  Family also reported that patient drinks excessively, and had been falling a lot recently, also reported that patient had some mild left leg weakness. Pt with Acute CVA - bilateral anterior basal ganglia region and left superior frontal gyrus with other acute/subacute infarcts suggesting embolic event - no known history of Afib.  Neurology on site not available at [facility] today. BSE/SLE requested. He initially passed the Yale swallow screen, but is now having difficulty swallowing.   Assessment / Plan / Recommendation Clinical Impression  Clinical swallow evaluation completed at bedside after Pt repositioned and oral care provided. Pt with black tongue and thick, ropy secretions alonger posterior oral cavity. Pt with strong vocal quality and congested cough. Pt with poor bolus manipulation and oral holding with suspected delay in swallow trigger with some immedate and delayed coughing and throat clearing with ice chips,  water, and puree. Oral suctioning was necessary across consistencies and yielded gross return. Recommend continue NPO with oral care and ok for single ice chips for comfort when alert and upright, po medications via alternative means and SLP to check Pt again tomorrow and consider MBSS if he is appropriate. Above to RN and MD. SLP will follow during acute stay and will need ongoing dysphagia intervention after acute care. SLP Visit Diagnosis: Dysphagia, unspecified (R13.10)    Aspiration Risk  Moderate aspiration risk;Risk for inadequate nutrition/hydration    Diet Recommendation NPO;Ice chips PRN after oral care   Medication Administration: Via alternative means    Other  Recommendations Oral Care Recommendations: Oral care QID;Oral care prior to ice chip/H20;Staff/trained caregiver to provide oral care Other Recommendations: Have oral suction available   Follow up Recommendations Skilled Nursing facility      Frequency and Duration min 2x/week  1 week       Prognosis Prognosis for Safe Diet Advancement: Fair Barriers to Reach Goals: Severity of deficits      Swallow Study   General Date of Onset: 03/02/21 HPI: 80 y.o. male with medical history significant for tobacco and alcohol abuse.  Patient was sent to the ED with reports of slurred speech, and confusion.  Family also reported that patient drinks excessively, and had been falling a lot recently, also reported that patient had some mild left leg weakness. Pt with Acute CVA - bilateral anterior basal ganglia region and left superior frontal gyrus with other acute/subacute infarcts suggesting embolic event - no known history of Afib.  Neu[facility] on [facility] not available at [facility]  today. BSE/SLE requested. He initially passed the Yale swallow screen, but is now having difficulty swallowing. Type of Study: Bedside Swallow Evaluation Previous Swallow Assessment: N/A Diet Prior to this Study: NPO Temperature Spikes Noted: No Respiratory  Status: Room air History of Recent Intubation: No Behavior/Cognition: Alert;Requires cueing Oral Cavity Assessment: Excessive secretions;Dried secretions Oral Care Completed by SLP: Yes Oral Cavity - Dentition: Poor condition Vision: Impaired for self-feeding Self-Feeding Abilities: Needs assist Patient Positioning: Upright in bed (Pt leans to his left) Baseline Vocal Quality: Normal Volitional Cough: Weak;Congested Volitional Swallow: Able to elicit    Oral/Motor/Sensory Function Overall Oral Motor/Sensory Function: Generalized oral weakness Facial Symmetry: Within Functional Limits Lingual ROM: Within Functional Limits Lingual Symmetry: Within Functional Limits Lingual Strength: Reduced   Ice Chips Ice chips: Impaired Presentation: Spoon Oral Phase Impairments: Reduced lingual movement/coordination Oral Phase Functional Implications: Oral holding Pharyngeal Phase Impairments: Suspected delayed Swallow;Throat Clearing - Delayed   Thin Liquid Thin Liquid: Impaired Presentation: Cup Oral Phase Impairments: Reduced lingual movement/coordination Oral Phase Functional Implications: Oral holding;Oral residue Pharyngeal  Phase Impairments: Suspected delayed Swallow;Cough - Immediate;Cough - Delayed    Nectar Thick Nectar Thick Liquid: Not tested   Honey Thick Honey Thick Liquid: Not tested   Puree Puree: Impaired Presentation: Spoon Oral Phase Impairments: Reduced lingual movement/coordination Oral Phase Functional Implications: Prolonged oral transit;Oral residue;Oral holding Pharyngeal Phase Impairments: Suspected delayed Swallow;Throat Clearing - Delayed   Solid     Solid: Not tested     Thank you,  [name], CCC-SLP [phone]  [name] 03/03/2021,3:57 PM

## 2021-03-04 ENCOUNTER — Inpatient Hospital Stay (HOSPITAL_COMMUNITY): Payer: Medicare Other

## 2021-03-04 DIAGNOSIS — Z7189 Other specified counseling: Secondary | ICD-10-CM

## 2021-03-04 DIAGNOSIS — Z515 Encounter for palliative care: Secondary | ICD-10-CM

## 2021-03-04 LAB — BASIC METABOLIC PANEL
Anion gap: 9 (ref 5–15)
BUN: 21 mg/dL (ref 8–23)
CO2: 22 mmol/L (ref 22–32)
Calcium: 8.7 mg/dL — ABNORMAL LOW (ref 8.9–10.3)
Chloride: 115 mmol/L — ABNORMAL HIGH (ref 98–111)
Creatinine, Ser: 0.87 mg/dL (ref 0.61–1.24)
GFR, Estimated: >60 mL/min (ref >60)
Glucose, Bld: 103 mg/dL — ABNORMAL HIGH (ref 70–99)
Potassium: 3.5 mmol/L (ref 3.5–5.1)
Sodium: 146 mmol/L — ABNORMAL HIGH (ref 135–145)

## 2021-03-04 LAB — CBC WITH DIFFERENTIAL/PLATELET
Abs Immature Granulocytes: 0.04 10*3/uL (ref 0.00–0.07)
Basophils Absolute: 0 10*3/uL (ref 0.0–0.1)
Basophils Relative: 0 %
Eosinophils Absolute: 0 10*3/uL (ref 0.0–0.5)
Eosinophils Relative: 0 %
HCT: 41.3 % (ref 39.0–52.0)
Hemoglobin: 13.6 g/dL (ref 13.0–17.0)
Immature Granulocytes: 0 %
Lymphocytes Relative: 9 %
Lymphs Abs: 1 10*3/uL (ref 0.7–4.0)
MCH: 30.6 pg (ref 26.0–34.0)
MCHC: 32.9 g/dL (ref 30.0–36.0)
MCV: 93 fL (ref 80.0–100.0)
Monocytes Absolute: 0.2 10*3/uL (ref 0.1–1.0)
Monocytes Relative: 2 %
Neutro Abs: 9.8 10*3/uL — ABNORMAL HIGH (ref 1.7–7.7)
Neutrophils Relative %: 89 %
Platelets: 182 10*3/uL (ref 150–400)
RBC: 4.44 MIL/uL (ref 4.22–5.81)
RDW: 15.5 % (ref 11.5–15.5)
WBC Morphology: INCREASED
WBC: 11.1 10*3/uL — ABNORMAL HIGH (ref 4.0–10.5)
nRBC: 0 % (ref 0.0–0.2)

## 2021-03-04 LAB — MAGNESIUM: Magnesium: 2.4 mg/dL (ref 1.7–2.4)

## 2021-03-04 MED ORDER — ASPIRIN EC 81 MG PO TBEC
81.0000 mg | DELAYED_RELEASE_TABLET | Freq: Every day | ORAL | Status: DC
  Administered 2021-03-04 – 2021-03-06 (×3): 81 mg via ORAL
  Filled 2021-03-04 (×3): qty 1

## 2021-03-04 MED ORDER — SODIUM CHLORIDE 0.45 % IV SOLN: INTRAVENOUS | Status: DC

## 2021-03-04 NOTE — TOC Progression Note (Signed)
Transition of Care [facility]) - Progression Note    Patient Details  Name: [name] MRN: [id number] Date of Birth: 1940-12-07  Transition [facility]) CM/SW Contact  Boneta Lucks, RN Phone Number: 03/04/2021, 3:54 PM  Clinical Narrative:   Palliative consulted, per niece patient is refusing SNF. Patient lives with her, she cares for him provides meals and assist as needed. Patient refuses to stop drinking. After conversation with niece MD was concerned someone was taking his money. TOC spoke with his about his money. She states she has his money, needs paperwork to do his banking for him. TOC explain she will need a lawyer for POA, our chaplain can help with Advanced direct, Chaplain consult placed. She has a check to pay his bills. She needs to get added to his bank account to assist with paying his bills.  He has friends that will pick up his alcohol and he pays for other services, no one is taking his money. He is alert and can makes his own choice with his money. Although the niece does not agree. She can just make sure he is fed and clean.    Expected Discharge Plan: [place] Barriers to Discharge: Continued Medical Work up  Expected Discharge Plan and Services Expected Discharge Plan: [place] arrangements for the past 2 months: Single Family Home                 Readmission Risk Interventions Readmission Risk Prevention Plan 03/04/2021  Medication Screening Complete  Transportation Screening Complete  Some recent data might be hidden

## 2021-03-04 NOTE — TOC Initial Note (Signed)
Transition of Care [facility]) - Initial/Assessment Note    Patient Details  Name: [name] MRN: [id number] Date of Birth: 1941/06/10  Transition of Care [facility]) CM/SW Contact:    Boneta Lucks, RN Phone Number: 03/04/2021, 1:57 PM  Clinical Narrative:      Patient admitted with acute CVA. Patient will need palliative consult and discuss feeding tube with family. PT is recommending SNF. Family is agreeable. [facility] sent out for bed offers. TOC to follow.       Expected Discharge Plan: Skilled Nursing Facility Barriers to Discharge: Continued Medical Work up  Patient Goals and CMS Choice Patient states their goals for this hospitalization and ongoing recovery are:: to go to rehab short term. CMS Medicare.gov Compare Post Acute Care list provided to:: Patient Represenative (must comment)    Expected Discharge Plan and Services Expected Discharge Plan: [place]    Living arrangements for the past 2 months: Single Family Home            Prior Living Arrangements/Services Living arrangements for the past 2 months: Single Family Home Lives with:: Relatives          Need for Family Participation in Patient Care: Yes (Comment) Care giver support system in place?: Yes (comment)   Criminal Activity/Legal Involvement Pertinent to Current Situation/Hospitalization: No - Comment as needed  Activities of Daily Living Home Assistive Devices/Equipment: None ADL Screening (condition at time of admission) Patient's cognitive ability adequate to safely complete daily activities?: No Is the patient deaf or have difficulty hearing?: No Does the patient have difficulty seeing, even when wearing glasses/contacts?: No Does the patient have difficulty concentrating, remembering, or making decisions?: Yes Patient able to express need for assistance with ADLs?: No Does the patient have difficulty dressing or bathing?: Yes Independently performs ADLs?: No Communication: Needs  assistance Is this a change from baseline?: Pre-admission baseline Dressing (OT): Dependent Is this a change from baseline?: Pre-admission baseline Grooming: Dependent Is this a change from baseline?: Pre-admission baseline Feeding: Dependent Is this a change from baseline?: Change from baseline, expected to last <3 days Bathing: Dependent Is this a change from baseline?: Pre-admission baseline Toileting: Dependent Is this a change from baseline?: Pre-admission baseline In/Out Bed: Needs assistance Is this a change from baseline?: Pre-admission baseline Walks in Home: Needs assistance Is this a change from baseline?: Pre-admission baseline Does the patient have difficulty walking or climbing stairs?: Yes Weakness of Legs: Both Weakness of Arms/Hands: Both  Permission Sought/Granted      Share Information with NAME: [name]     Permission granted to share info w Relationship: Niece     Emotional Assessment     Affect (typically observed): Accepting Orientation: : Oriented to Self, Oriented to Place, Oriented to  Time, Oriented to Situation Alcohol / Substance Use: Not Applicable Psych Involvement: No (comment)  Admission diagnosis:  Altered mental status [R41.82] Acute CVA (cerebrovascular accident) ([place]) [I63.9] Altered mental status, unspecified altered mental status type [R41.82] Cerebral infarction, unspecified mechanism [facility]) [I63.9] Patient Active Problem List   Diagnosis Date Noted   Acute CVA (cerebrovascular accident) ([place]) 03/02/2021   Normocytic anemia 03/14/2013   Heme positive stool 03/14/2013   BACK PAIN, LUMBAR 68/10/2120   HERNIA, UMBILICAL 48/25/0037   MIXED HYPERLIPIDEMIA 08/17/2007   Alcohol abuse 05/17/2007   Essential hypertension 05/17/2007   TOBACCO ABUSE 05/02/2007   PROTEINURIA 05/02/2007   ELECTROCARDIOGRAM, ABNORMAL 05/02/2007   PCP:  [name], MD Pharmacy:   [place], [place] -  [address] [place] [address] Phone: [phone] Fax: [phone]   Readmission Risk Interventions Readmission Risk Prevention Plan 03/04/2021  Medication Screening Complete  Transportation Screening Complete  Some recent data might be hidden

## 2021-03-04 NOTE — Progress Notes (Addendum)
PROGRESS NOTE   [name]  HQI:[id number] DOB: [birth year] DOA: 03/02/2021 PCP: Gareth Morgan, MD   Chief Complaint  Patient presents with   Altered Mental Status   Level of care: Telemetry  Brief Admission History:  80 y.o. male with medical history significant for tobacco and alcohol abuse.  Patient was sent to the ED with reports of slurred speech, and confusion.  Family also reported that patient drinks excessively, and had been falling a lot recently, also reported that patient had some mild left leg weakness.  At the time of my evaluation, patient is awake alert and oriented to person place and time, answering most questions but not all, it is hard to tell if his speech is actually slurred as he is not verbalizing much, and tone of his voice is low.  He agrees he had abnormal speech, but is not aware of any confusion.  He drinks alcoholic beverages daily, about 1 pints of gin daily.  Last drink was yesterday afternoon at about 5 PM.  He also reports a mild productive cough, but denies difficulty breathing.  Assessment & Plan:   Principal Problem:   Acute CVA (cerebrovascular accident) [facility]) Active Problems:   Alcohol abuse   Essential hypertension  Acute CVA - bilateral anterior basal ganglia region and left superior frontal gyrus with other acute/subacute infarcts suggesting embolic event - no known history of Afib.  Neurology on site not available at [facility] today.  I have spoken with covering neurologist Dr. Wilford Corner at [facility].  He reviewed patient's case with me including images, meds and labs and echo.  Recommendation was for patient to continue aspirin 81 mg daily.  Statin not recommended at this time due to LDL being 42 and he was on no prior statin.  He recommended a 30 day event monitor. I have called cardiology office to make arrangements for a 30 day monitor.  Pt was enrolled in [employer]. Pt will need to follow up with Dr. Gerilyn Pilgrim outpatient.  Completing stroke work up  in hospital. He likely will need SNF rehab.     Capacity Evaluation  Patient does not appear to have the capacity at this time to make an informed decision to agree to or refuse treatment.     The patient lacks capacity for the following reason(s): - Patient lacks an appreciation of the nature of his/her current circumstances or psychiatric condition and the decision to treat the condition with medication because   - Patient denies the existence of an illness requiring treatment   - Patient expresses delusions about current circumstances and home living situation   - Illness affects person's ability to appreciate how treatment might be of benefit from rehabilitation and alcohol cessation  Alcohol abuse - Pt is on strict CIWA withdrawal protocol.  He is receiving thiamine, multivitamin and folic acid.    Dysphagia - SLP team performing barium swallow evaluation on him today.  Further recommendations to follow.    Essential hypertension - permissive hypertension in setting of acute CVA, start lowering BP slowly over next 1-2 days.   Metabolic encephalopathy - secondary to acute CVA, slowly improving with supportive care.    AKI - resolved with IV fluid hydration.   Hypernatremia - Changed IV fluid to 0.45% NS. Recheck BMP in AM.    Leukocytosis - he is afebrile, I worry about aspiration of secretions and aspiration pneumonia, check portable CXR.  Check CBC diff in AM.   DVT prophylaxis: lovenox  Code Status: full  Family Communication: I spoke with niece 7/11 and gave full update.  Disposition: anticipating SNF placement  Status is: Inpatient  Remains inpatient appropriate because:Inpatient level of care appropriate due to severity of illness  Dispo: The patient is from: Home              Anticipated d/c is to: SNF              Patient currently is not medically stable to d/c.   Difficult to place patient No   Consultants:  Neurology telephone Speech  therapy OT PT  Procedures:  MRI brain   Antimicrobials:  N/a    Subjective: Pt said that he felt fairly well today no specific complaints.  He is intermittently confused.     Objective: Vitals:   03/03/21 1845 03/03/21 1948 03/04/21 0014 03/04/21 0348  BP:  (!) 156/68 (!) 156/76 (!) 155/85  Pulse: 98 96 90 86  Resp: 18 18 18 18   Temp: 99.9 F (37.7 C) 98.8 F (37.1 C) 98.2 F (36.8 C) 97.9 F (36.6 C)  TempSrc: Axillary Oral Oral Oral  SpO2: 93% 93% 95% 95%  Weight:      Height:        Intake/Output Summary (Last 24 hours) at 03/04/2021 1317 Last data filed at 03/04/2021 1111 Gross per 24 hour  Intake 1264.21 ml  Output 200 ml  Net 1064.21 ml   Filed Weights   03/02/21 1311  Weight: 63.5 kg   Examination:  General exam: severe dental caries, intermittently confused, poorly cared for, chronically ill appearing, muscle wasting in face, cheeks and skeletal muscle, emaciated and poorly groomed, curled up in fetal position. Having intermittent delusions.   Respiratory system: Clear to auscultation. Respiratory effort normal. Cardiovascular system: normal S1 & S2 heard. No JVD, murmurs, rubs, gallops or clicks. No pedal edema. Gastrointestinal system: Abdomen is nondistended, soft and nontender. No organomegaly or masses felt. Normal bowel sounds heard. Central nervous system: left UE 3-4/5 strength, LLE 4/5, RUE, RLE 5/5.  Extremities: no cyanosis or clubbing seen.  Skin: No rashes, lesions or ulcers Psychiatry: Judgement and insight appear poor. Mood & affect flat.   Data Reviewed: I have personally reviewed following labs and imaging studies  CBC: Recent Labs  Lab 03/02/21 1345 03/03/21 0327 03/04/21 0610  WBC 5.3 6.8 11.1*  NEUTROABS 4.1  --  9.8*  HGB 15.8 14.4 13.6  HCT 49.2 43.6 41.3  MCV 95.3 92.6 93.0  PLT 227 228 182    Basic Metabolic Panel: Recent Labs  Lab 03/02/21 1345 03/02/21 1603 03/03/21 0327 03/04/21 0610  NA 140  --  139 146*  K  4.1  --  3.5 3.5  CL 102  --  110 115*  CO2 20*  --  23 22  GLUCOSE 195*  --  162* 103*  BUN 26*  --  20 21  CREATININE 1.43*  --  1.05 0.87  CALCIUM 9.4  --  8.6* 8.7*  MG  --  1.5* 2.3 2.4  PHOS  --  2.2*  --   --     GFR: Estimated Creatinine Clearance: 61.8 mL/min (by C-G formula based on SCr of 0.87 mg/dL).  Liver Function Tests: Recent Labs  Lab 03/02/21 1345  AST 29  ALT 22  ALKPHOS 72  BILITOT 1.6*  PROT 7.9  ALBUMIN 4.0    CBG: No results for input(s): GLUCAP in the last 168 hours.  Recent Results (from the past 240 hour(s))  Resp Panel  by RT-PCR (Flu A&B, Covid) Nasopharyngeal Swab     Status: None   Collection Time: 03/02/21  2:29 PM   Specimen: Nasopharyngeal Swab; Nasopharyngeal(NP) swabs in vial transport medium  Result Value Ref Range Status   SARS Coronavirus 2 by RT PCR NEGATIVE NEGATIVE Final    Comment: (NOTE) SARS-CoV-2 target nucleic acids are NOT DETECTED.  The SARS-CoV-2 RNA is generally detectable in upper respiratory specimens during the acute phase of infection. The lowest concentration of SARS-CoV-2 viral copies this assay can detect is 138 copies/mL. A negative result does not preclude SARS-Cov-2 infection and should not be used as the sole basis for treatment or other patient management decisions. A negative result may occur with  improper specimen collection/handling, submission of specimen other than nasopharyngeal swab, presence of viral mutation(s) within the areas targeted by this assay, and inadequate number of viral copies(<138 copies/mL). A negative result must be combined with clinical observations, patient history, and epidemiological information. The expected result is Negative.  Fact Sheet for Patients:  BloggerCourse.com  Fact Sheet for Healthcare Providers:  SeriousBroker.it  This test is no t yet approved or cleared by the [place] FDA and  has been authorized  for detection and/or diagnosis of SARS-CoV-2 by FDA under an Emergency Use Authorization (EUA). This EUA will remain  in effect (meaning this test can be used) for the duration of the COVID-19 declaration under Section 564(b)(1) of the Act, 21 U.S.C.section 360bbb-3(b)(1), unless the authorization is terminated  or revoked sooner.       Influenza A by PCR NEGATIVE NEGATIVE Final   Influenza B by PCR NEGATIVE NEGATIVE Final    Comment: (NOTE) The Xpert Xpress SARS-CoV-2/FLU/RSV plus assay is intended as an aid in the diagnosis of influenza from Nasopharyngeal swab specimens and should not be used as a sole basis for treatment. Nasal washings and aspirates are unacceptable for Xpert Xpress SARS-CoV-2/FLU/RSV testing.  Fact Sheet for Patients: BloggerCourse.com  Fact Sheet for Healthcare Providers: SeriousBroker.it  This test is not yet approved or cleared by the [place] FDA and has been authorized for detection and/or diagnosis of SARS-CoV-2 by FDA under an Emergency Use Authorization (EUA). This EUA will remain in effect (meaning this test can be used) for the duration of the COVID-19 declaration under Section 564(b)(1) of the Act, 21 U.S.C. section 360bbb-3(b)(1), unless the authorization is terminated or revoked.  Performed at [facility], [address], [place], [place] [address]      Radiology Studies: DG Chest 1 View  Result Date: 03/02/2021 CLINICAL DATA:  Stroke-like symptoms. Altered mental status. Smoker. EXAM: CHEST  1 VIEW COMPARISON:  09/26/2011. FINDINGS: Midline trachea. Normal heart size. Atherosclerosis in the transverse aorta. No pleural effusion or pneumothorax. Basilar and peripheral predominant interstitial thickening is greater left than right. Given differences in technique, relatively similar to 2013. IMPRESSION: Left greater than right basilar predominant interstitial opacities are similar to 2013.  Possibly related to chronic bronchitis/smoking. Left lower lobe pneumonia cannot be entirely excluded. Interstitial lung disease is possible, but the lung bases on the 12/31/2018 CT appear relatively uninvolved. Electronically Signed   By: Jeronimo Greaves M.D.   On: 03/02/2021 14:44   CT Head Wo Contrast  Result Date: 03/02/2021 CLINICAL DATA:  80 year old male with altered mental status. EXAM: CT HEAD WITHOUT CONTRAST TECHNIQUE: Contiguous axial images were obtained from the base of the skull through the vertex without intravenous contrast. COMPARISON:  10/24/2020 CT FINDINGS: Brain: A new RIGHT caudate/basal ganglia infarct is noted  since 10/24/2020. Atrophy, chronic small-vessel white matter ischemic changes and remote LEFT basal ganglia infarcts again noted. There is no evidence of hemorrhage, extra-axial collection, mass lesion, midline shift or hydrocephalus. Vascular: Carotid atherosclerotic calcifications are noted. Skull: Normal. Negative for fracture or focal lesion. Sinuses/Orbits: No acute finding. Other: None IMPRESSION: 1. New RIGHT caudate/basal ganglia infarct since 10/24/2020 - likely acute to subacute. No evidence of hemorrhage. 2. Atrophy, chronic small-vessel white matter ischemic changes and remote LEFT basal ganglia infarcts. Electronically Signed   By: Harmon Pier M.D.   On: 03/02/2021 14:22   MR BRAIN WO CONTRAST  Result Date: 03/03/2021 CLINICAL DATA:  Stroke follow-up. Slurred speech, left-sided weakness. EXAM: MRI HEAD WITHOUT CONTRAST TECHNIQUE: Multiplanar, multiecho pulse sequences of the brain and surrounding structures were obtained without intravenous contrast. COMPARISON:  Head CT March 02, 2021. FINDINGS: Brain: Foci of restricted diffusion are seen in the bilateral anterior basal ganglia region, measuring approximately 18 mm on the right and 8 mm on the left, consistent with acute/subacute infarct. A punctate focus of restricted diffusion is also seen in the left superior  frontal gyrus. No hemorrhage, hydrocephalus, extra-axial collection or mass lesion. Small remote infarcts are seen in the left cerebellar hemisphere, thalami, bilateral basal ganglia region, left corona radiata and right centrum semiovale. Scattered and confluent foci of T2 hyperintensity are seen within the white matter of the cerebral hemispheres, nonspecific, most likely related to chronic small vessel ischemia. Moderate parenchymal volume loss. Vascular: Normal flow voids. Skull and upper cervical spine: Normal marrow signal. Sinuses/Orbits: Negative. Other: None. IMPRESSION: 1. Bilateral anterior basal ganglia region and left superior frontal gyrus acute/subacute infarcts. 2. Remote small infarcts in the left cerebellar hemisphere, thalami, bilateral basal ganglia region, left corona radiata and right centrum semiovale. 3. Mild chronic microvascular ischemic changes of the white matter and moderate parenchymal volume loss. Electronically Signed   By: Baldemar Lenis M.D.   On: 03/03/2021 12:35   ECHOCARDIOGRAM COMPLETE  Result Date: 03/03/2021    ECHOCARDIOGRAM REPORT   Patient Name:   [name] Date of Exam: 03/03/2021 Medical Rec #:  [id number]      Height:       70.0 in Accession #:    [id number]     Weight:       140.0 lb Date of Birth:  Jun 17, 1941       BSA:          1.794 m Patient Age:    80 years       BP:           132/62 mmHg Patient Gender: M              HR:           90 bpm. [facility] Procedure: 2D Echo, Cardiac Doppler and Color Doppler Indications:    CVA  History:        Patient has no prior history of Echocardiogram examinations.                 Risk Factors:Current Smoker and Hypertension. Alcohol abuse.  Sonographer:    Lavenia Atlas RDCS Referring Phys: [phone] Heloise Beecham [facility] IMPRESSIONS  1. Left ventricular ejection fraction, by estimation, is 60 to 65%. The left ventricle has normal function. The left ventricle has no regional wall motion  abnormalities. There is mild left ventricular hypertrophy. Left ventricular diastolic parameters are consistent with Grade I diastolic dysfunction (impaired relaxation).  2. Right ventricular  systolic function is normal. The right ventricular size is normal. Tricuspid regurgitation signal is inadequate for assessing PA pressure.  3. The mitral valve is normal in structure. No evidence of mitral valve regurgitation. No evidence of mitral stenosis.  4. The aortic valve is tricuspid. Aortic valve regurgitation is not visualized. No aortic stenosis is present.  5. The inferior vena cava is normal in size with greater than 50% respiratory variability, suggesting right atrial pressure of 3 mmHg. FINDINGS  Left Ventricle: Left ventricular ejection fraction, by estimation, is 60 to 65%. The left ventricle has normal function. The left ventricle has no regional wall motion abnormalities. The left ventricular internal cavity size was normal in size. There is  mild left ventricular hypertrophy. Left ventricular diastolic parameters are consistent with Grade I diastolic dysfunction (impaired relaxation). Normal left ventricular filling pressure. Right Ventricle: The right ventricular size is normal. No increase in right ventricular wall thickness. Right ventricular systolic function is normal. Tricuspid regurgitation signal is inadequate for assessing PA pressure. Left Atrium: Left atrial size was normal in size. Right Atrium: Right atrial size was normal in size. Pericardium: There is no evidence of pericardial effusion. Mitral Valve: The mitral valve is normal in structure. No evidence of mitral valve regurgitation. No evidence of mitral valve stenosis. Tricuspid Valve: The tricuspid valve is normal in structure. Tricuspid valve regurgitation is not demonstrated. No evidence of tricuspid stenosis. Aortic Valve: The aortic valve is tricuspid. Aortic valve regurgitation is not visualized. No aortic stenosis is present. Aortic  valve mean gradient measures 3.9 mmHg. Aortic valve peak gradient measures 6.8 mmHg. Aortic valve area, by VTI measures 5.13 cm. Pulmonic Valve: The pulmonic valve was not well visualized. Pulmonic valve regurgitation is not visualized. No evidence of pulmonic stenosis. Aorta: The aortic root is normal in size and structure. Venous: The inferior vena cava is normal in size with greater than 50% respiratory variability, suggesting right atrial pressure of 3 mmHg. IAS/Shunts: No atrial level shunt detected by color flow Doppler.  LEFT VENTRICLE PLAX 2D LVIDd:         3.30 cm     Diastology LVIDs:         2.10 cm     LV e' medial:    3.48 cm/s LV PW:         1.30 cm     LV E/e' medial:  14.2 LV IVS:        1.20 cm     LV e' lateral:   5.33 cm/s LVOT diam:     2.40 cm     LV E/e' lateral: 9.2 LV SV:         91 LV SV Index:   51 LVOT Area:     4.52 cm  LV Volumes (MOD) LV vol d, MOD A4C: 68.3 ml LV vol s, MOD A4C: 40.7 ml LV SV MOD A4C:     68.3 ml RIGHT VENTRICLE RV Basal diam:  2.30 cm RV S prime:     14.35 cm/s TAPSE (M-mode): 1.6 cm LEFT ATRIUM             Index       RIGHT ATRIUM           Index LA diam:        3.20 cm 1.78 cm/m  RA Area:     13.40 cm LA Vol (A2C):   45.1 ml 25.14 ml/m RA Volume:   34.90 ml  19.46 ml/m LA Vol (A4C):  34.3 ml 19.12 ml/m LA Biplane Vol: 40.5 ml 22.58 ml/m  AORTIC VALVE AV Area (Vmax):    4.24 cm AV Area (Vmean):   3.61 cm AV Area (VTI):     5.13 cm AV Vmax:           130.31 cm/s AV Vmean:          93.659 cm/s AV VTI:            0.178 m AV Peak Grad:      6.8 mmHg AV Mean Grad:      3.9 mmHg LVOT Vmax:         122.00 cm/s LVOT Vmean:        74.800 cm/s LVOT VTI:          0.202 m LVOT/AV VTI ratio: 1.13  AORTA Ao Root diam: 2.70 cm MITRAL VALVE MV Area (PHT): 3.61 cm    SHUNTS MV Decel Time: 210 msec    Systemic VTI:  0.20 m MV E velocity: 49.30 cm/s  Systemic Diam: 2.40 cm MV A velocity: 77.10 cm/s MV E/A ratio:  0.64 Dina Rich MD Electronically signed by Dina Rich MD Signature Date/Time: 03/03/2021/9:14:07 AM    Final    CT ANGIO HEAD NECK W WO CM (CODE STROKE)  Result Date: 03/02/2021 CLINICAL DATA:  Stroke follow-up. EXAM: CT ANGIOGRAPHY HEAD AND NECK TECHNIQUE: Multidetector CT imaging of the head and neck was performed using the standard protocol during bolus administration of intravenous contrast. Multiplanar CT image reconstructions and MIPs were obtained to evaluate the vascular anatomy. Carotid stenosis measurements (when applicable) are obtained utilizing NASCET criteria, using the distal internal carotid diameter as the denominator. CONTRAST:  75mL OMNIPAQUE IOHEXOL 350 MG/ML SOLN COMPARISON:  Same day CT head. FINDINGS: CTA NECK FINDINGS Aortic arch: Calcific atherosclerosis of the aorta. Great vessel origins are patent. Right carotid system: Mild atherosclerosis at the carotid bifurcation without greater than 50% stenosis. Tortuous ICA. Left carotid system: Mild-to-moderate atherosclerosis at the carotid bifurcation without greater than 50% stenosis. Vertebral arteries: Right dominant. Tortuous vertebral arteries bilaterally. Mild atherosclerosis of the proximal right vertebral artery without greater than 50% stenosis. Skeleton: Moderate to severe multilevel degenerative change of the cervical spine, including disc height loss and endplate spurring. Other neck: No acute abnormality. Upper chest: Similar biapical pleuroparenchymal scarring in comparison to remote 2013 prior. In addition, there is an irregular 6 mm pulmonary nodule in the right upper lung with small calcification in an area that was previously not imaged. Review of the MIP images confirms the above findings CTA HEAD FINDINGS Anterior circulation: Bilateral intracranial ICA calcific atherosclerosis without evidence of greater than 50% stenosis. Bilateral MCAs are patent without proximal flow limiting stenosis. Small right A1 ACA. Bilateral ACAs are patent with severe stenosis of the left A2  ACA (series 7, image 106; series 6, image 281). No aneurysm identified. Posterior circulation: Moderate stenosis of the non dominant left intradural vertebral artery. Dominant right vertebral artery and basilar artery are patent without flow-limiting stenosis. Small right P1 PCA with prominent right posterior communicating artery, anatomic variant. Bilateral PCAs are patent without proximal flow limiting stenosis. Venous sinuses: Limited evaluation due to arterial timing without specific evidence of dural venous sinus thrombosis. Anatomic variants: See above. Review of the MIP images confirms the above findings IMPRESSION: CTA head: 1. No large vessel occlusion. 2. Severe stenosis of the left A2 ACA. 3. Moderate stenosis of the non dominant left intradural vertebral artery. CTA Neck: 1. Mild to  moderate atherosclerosis in the neck without greater than 50% stenosis. 2. Approximately 6 mm irregular/linear pulmonary nodule in the right upper lung with small calcification. While this could potentially represent area of scarring, a non-contrast chest CT at 6-12 months is recommended to ensure stability. If the nodule is stable at time of repeat CT, then future CT at 18-24 months (from today's scan) is considered optional for low-risk patients, but is recommended for high-risk patients. This recommendation follows the consensus statement: Guidelines for Management of Incidental Pulmonary Nodules Detected on CT Images: From the Fleischner Society 2017; Radiology 2017; 284:228-243. Electronically Signed   By: Feliberto Harts MD   On: 03/02/2021 17:05    Scheduled Meds:  aspirin  150 mg Rectal Daily   atorvastatin  40 mg Oral Daily   enoxaparin (LOVENOX) injection  40 mg Subcutaneous Q24H   folic acid  1 mg Intravenous Daily   multivitamin with minerals  1 tablet Oral Daily   thiamine  100 mg Oral Daily   Or   thiamine  100 mg Intravenous Daily   Continuous Infusions:  sodium chloride 100 mL/hr at 03/04/21  1025     LOS: 2 days   Time spent: 35 mins  Keeven Matty Laural Benes, MD How to contact the [facility] Attending or Consulting provider 7A - 7P or covering provider during after hours 7P -7A, for this patient?  Check the care team in [facility] and look for a) attending/consulting [facility] provider listed and b) the [facility] team listed Log into [email or web address] and use [facility]'s universal password to access. If you do not have the password, please contact the hospital operator. Locate the [facility] provider you are looking for under [facility] and page to a number that you can be directly reached. If you still have difficulty reaching the provider, please page the [facility] (Director on Call) for the Hospitalists listed on amion for assistance.  03/04/2021, 1:17 PM

## 2021-03-04 NOTE — Progress Notes (Signed)
SLP Cancellation Note  Patient Details Name: [name] MRN: [id number] DOB: 01/10/1941   Cancelled treatment:        Acknowledge order for speech language evaluation, however, secondary to scheduling today and evaluating/treating primary need first (dysphagia) SLE was not completed today. Follow for complete SLE tomorrow, thank you,  Ladawn Boullion H. Roddie Mc, CCC-SLP Speech Language Pathologist    Wende Bushy 03/04/2021, 2:26 PM

## 2021-03-04 NOTE — [place] FL2 (Signed)
[place] MEDICAID FL2 LEVEL OF CARE SCREENING TOOL     IDENTIFICATION  Patient Name: [name] Birthdate: [birth year] Sex: male Admission Date (Current Location): 03/02/2021  [facility] and [place] Number:  [employer] and Address:  [place] [address] [place]      Provider Number: [id number]  Attending Physician Name and Address:  Murlean Iba, MD  Relative Name and Phone Number:  [name]- Niece [phone]    Current Level of Care: Hospital Recommended Level of Care: [place] Prior Approval Number:    Date Approved/Denied:   PASRR Number: [id number]  Discharge Plan: SNF    Current Diagnoses: Patient Active Problem List   Diagnosis Date Noted   Acute CVA (cerebrovascular accident) ([place]) 03/02/2021   Normocytic anemia 03/14/2013   Heme positive stool 03/14/2013   BACK PAIN, LUMBAR 84/13/2440   HERNIA, UMBILICAL 06/20/2535   MIXED HYPERLIPIDEMIA 08/17/2007   Alcohol abuse 05/17/2007   Essential hypertension 05/17/2007   TOBACCO ABUSE 05/02/2007   PROTEINURIA 05/02/2007   ELECTROCARDIOGRAM, ABNORMAL 05/02/2007    Orientation RESPIRATION BLADDER Height & Weight     Self, Time, Situation, Place  Normal External catheter Weight: 63.5 kg Height:  5\' 10"  (177.8 cm)  BEHAVIORAL SYMPTOMS/MOOD NEUROLOGICAL BOWEL NUTRITION STATUS      Continent Diet (See DC summary)  AMBULATORY STATUS COMMUNICATION OF NEEDS Skin   Extensive Assist Verbally Skin abrasions                       Personal Care Assistance Level of Assistance  Bathing, Feeding, Dressing Bathing Assistance: Maximum assistance Feeding assistance: Limited assistance Dressing Assistance: Maximum assistance     Functional Limitations Info  Sight, Speech, Hearing Sight Info: Adequate Hearing Info: Adequate Speech Info: Adequate    SPECIAL CARE FACTORS FREQUENCY                       Contractures Contractures  Info: Not present    Additional Factors Info  Code Status, Allergies Code Status Info: FULL Allergies Info: NKDA           Current Medications (03/04/2021):  This is the current hospital active medication list Current Facility-Administered Medications  Medication Dose Route Frequency Provider Last Rate Last Admin   0.45 % sodium chloride infusion   Intravenous Continuous Johnson, Clanford L, MD 100 mL/hr at 03/04/21 1025 New Bag at 03/04/21 1025   acetaminophen (TYLENOL) tablet 650 mg  650 mg Oral Q6H PRN Emokpae, Ejiroghene E, MD       Or   acetaminophen (TYLENOL) suppository 650 mg  650 mg Rectal Q6H PRN Emokpae, Ejiroghene E, MD       aspirin suppository 150 mg  150 mg Rectal Daily Johnson, Clanford L, MD   150 mg at 03/03/21 2101   atorvastatin (LIPITOR) tablet 40 mg  40 mg Oral Daily Emokpae, Ejiroghene E, MD   40 mg at 03/02/21 2118   enoxaparin (LOVENOX) injection 40 mg  40 mg Subcutaneous Q24H Emokpae, Ejiroghene E, MD   40 mg at 64/40/34 7425   folic acid injection 1 mg  1 mg Intravenous Daily Johnson, Clanford L, MD   1 mg at 03/04/21 1023   LORazepam (ATIVAN) tablet 1-4 mg  1-4 mg Oral Q1H PRN Emokpae, Ejiroghene E, MD       Or   LORazepam (ATIVAN) injection 1-4 mg  1-4 mg Intravenous Q1H PRN Emokpae, Ejiroghene E,  MD       multivitamin with minerals tablet 1 tablet  1 tablet Oral Daily Emokpae, Ejiroghene E, MD   1 tablet at 03/02/21 2035   ondansetron (ZOFRAN) tablet 4 mg  4 mg Oral Q6H PRN Emokpae, Ejiroghene E, MD       Or   ondansetron (ZOFRAN) injection 4 mg  4 mg Intravenous Q6H PRN Emokpae, Ejiroghene E, MD       polyethylene glycol (MIRALAX / GLYCOLAX) packet 17 g  17 g Oral Daily PRN Emokpae, Ejiroghene E, MD       thiamine tablet 100 mg  100 mg Oral Daily Emokpae, Ejiroghene E, MD       Or   thiamine (B-1) injection 100 mg  100 mg Intravenous Daily Emokpae, Ejiroghene E, MD   100 mg at 03/04/21 1023     Discharge Medications: Please see discharge summary for  a list of discharge medications.  Relevant Imaging Results:  Relevant Lab Results:   Additional Information SS# [id number]  Boneta Lucks, RN

## 2021-03-04 NOTE — Evaluation (Signed)
Modified Barium Swallow Progress Note  Patient Details  Name: [name] MRN: [id number] Date of Birth: 11-13-1940  Today's Date: 03/04/2021  Modified Barium Swallow completed.  Full report located under Chart Review in the Imaging Section.  Brief recommendations include the following:  Clinical Impression  Pt presents with moderate/severe oral dysphagia and mild pharyngeal dysphagia; no aspiration was visualized with any consistencies or textures only flash penetration visualized with consecutive straw sips of thin liquids. Pt demonstrates prolonged oral manipulation of bolus and significant oral holding with some puree/regular presentations that required cues to "swallow". There was less oral holding with thin liquids and swallowing trigger was more timely/swift than with solid textures. With solid textures Pt with lingual pumping and delay in swallowing trigger often not triggered until majority of the bolus sits in the valleculae for 3-5 seconds. Note good laryngeal vestibule closure and airway protection across consistencies; note slightly decreased pharyngeal squeeze resulting in mild/mod vallecuale and pyriform residue across consistencies. With regular textures, after masticating presentation Pt just held bolus in his mouth until SLP provided verbal cue to swallow (~10 seconds). Bolus prep was more efficient and swift with puree, however still required mod verbal cues to "swallow". Recommend initiate puree/D1 diet with thin liquids. Recommend avoid straws secondary to decreased awareness and noted consistent penetration with thin liqudis via straw. Meds are ok whole with liquids. Pt will require 1:1 support for all meals for verbal cues and support; alternating bites and sips was somewhat effective to facilitate AP transfer of bolus. After meals and throughout meal please check Pt's mouth for residue and holding. Secondary to cognitive deficits/poor awareness of deficits and oral holding, Pt is  still at risk for malnutrition. ST will continue to follow acutely for diet tolerance.    Swallow Evaluation Recommendations       SLP Diet Recommendations: Dysphagia 1 (Puree) solids;Thin liquid   Liquid Administration via: No straw;Cup   Medication Administration: Whole meds with liquid   Supervision: Full supervision/cueing for compensatory strategies   Compensations: Minimize environmental distractions;Slow rate;Small sips/bites   Postural Changes: Seated upright at 90 degrees   Oral Care Recommendations: Oral care QID      Lewis Keats H. Roddie Mc, CCC-SLP Speech Language Pathologist   Wende Bushy 03/04/2021,2:22 PM

## 2021-03-04 NOTE — Consult Note (Signed)
Consultation Note Date: 03/04/2021   Patient Name: [name]  DOB: 08/10/1941  MRN: [id number]  Age / Sex: 80 y.o., male  PCP: [name], MD Referring Physician: Murlean Iba, MD  Reason for Consultation: Establishing goals of care "may need to decide about feeding tube, niece has been caring for him"  HPI/Patient Profile: 80 y.o. male  with past medical history of tobacco and alcohol abuse, GI bleeding, normocytic anemia, HTN, chronic low back pain, arthritis, admitted on 03/02/2021 with slurred speech. Workup revealed new and old infarcts. No motor deficits noted. SLP eval noted for dysphagia and recommendations made for pureed diet. Palliative medicine consulted for goals of care.    Clinical Assessment and Goals of Care: I have reviewed medical records including EPIC notes, labs and imaging, received report from RN, assessed the patient and then met at the bedside along with patient and his niece- Maudie Mercury- who is his primary caretaker to discuss diagnosis prognosis, GOC, EOL wishes, disposition and options.  Mr. Bussiere is awake, alert and oriented. His niece notes that his speech is at baseline. Kanye agrees and also feels that his swallowing has not changed and he is very ready to eat.   I introduced Palliative Medicine as specialized medical care for people living with serious illness. It focuses on providing relief from the symptoms and stress of a serious illness. The goal is to improve quality of life for both the patient and the family.  We discussed a brief life review of the patient and then focused on their current illness. The natural disease trajectory and expectations at EOL were discussed. We discussed his likelihood of having a more catastrophic stroke.   Prior to this admission he was living at home. His niece Maudie Mercury cares for him. He was able to ambulate to the bathroom on his own by  cruising on furniture. I reviewed PT eval with patient and Maudie Mercury and the fact that he required assistance getting up and with walking- Maudie Mercury states that she can provide assistance for him in the home and that 24 hour care is available. She and patient requested home PT.   I attempted to elicit values and goals of care important to the patient. His primary goal is to be at home, sit on his porch and drink his gin. He adamantly states he does not now nor ever want to go to a rehab. He does acknowledge that he has a drinking problem- however, he is not motivated to change that fact.  Advanced directives, concepts specific to code status, artifical feeding and hydration, and rehospitalization were considered and discussed. [employer] elects for DNR and would not ever want a feeding tube.   Palliative Care services outpatient were explained and offered. Patient and family are agreeable to outpatient Palliative referral.  Discussed the importance of continued conversation with family and the medical providers regarding overall plan of care and treatment options, ensuring decisions are within the context of the patient's values and GOCs.    Primary Decision Maker PATIENT  SUMMARY OF RECOMMENDATIONS -DNR -No feeding tube (not needed at this point, however, patient would never want one in the event that it was needed to sustain life) -Goals are to discharge home with PT and Palliative services where he enjoys sitting on his porch and drinking his gin    Code Status/Advance Care Planning: DNR   Prognosis:   Unable to determine  Discharge Planning: To Be Determined  Primary Diagnoses: Present on Admission:  Acute CVA (cerebrovascular accident) ([place])  Alcohol abuse  Essential hypertension   I have reviewed the medical record, interviewed the patient and family, and examined the patient. The following aspects are pertinent.  Past Medical History:  Diagnosis Date   ETOH abuse    Hypertension     Social History   Socioeconomic History   Marital status: Divorced    Spouse name: Not on file   Number of children: 12   Years of education: Not on file   Highest education level: Not on file  Occupational History   Not on file  Tobacco Use   Smoking status: Every Day    Pack years: 0.00    Types: Cigars   Smokeless tobacco: Never  Substance and Sexual Activity   Alcohol use: Yes    Comment: Unknown   Drug use: No   Sexual activity: Not on file  Other Topics Concern   Not on file  Social History Narrative   Not on file   Social Determinants of Health   Financial Resource Strain: Not on file  Food Insecurity: Not on file  Transportation Needs: Not on file  Physical Activity: Not on file  Stress: Not on file  Social Connections: Not on file   Scheduled Meds:  aspirin EC  81 mg Oral Daily   atorvastatin  40 mg Oral Daily   enoxaparin (LOVENOX) injection  40 mg Subcutaneous Y37Q   folic acid  1 mg Intravenous Daily   multivitamin with minerals  1 tablet Oral Daily   thiamine  100 mg Oral Daily   Or   thiamine  100 mg Intravenous Daily   Continuous Infusions:  sodium chloride 100 mL/hr at 03/04/21 1025   PRN Meds:.acetaminophen **OR** acetaminophen, LORazepam **OR** LORazepam, ondansetron **OR** ondansetron (ZOFRAN) IV, polyethylene glycol Medications Prior to Admission:  Prior to Admission medications   Medication Sig Start Date End Date Taking? Authorizing Provider  amoxicillin-clarithromycin-lansoprazole [facility]) combo pack Take by mouth 2 (two) times daily. Follow package directions. Patient not taking: No sig reported 04/06/13   Rourk, Cristopher Estimable, MD  losartan-hydrochlorothiazide (HYZAAR) 100-25 MG per tablet Take 1 tablet by mouth daily.  Patient not taking: No sig reported 01/17/13   [provider]  nabumetone (RELAFEN) 750 MG tablet Take 750 mg by mouth 2 (two) times daily.  Patient not taking: No sig reported 01/20/13   [provider]   ondansetron (ZOFRAN ODT) 4 MG disintegrating tablet 44m ODT q4 hours prn nausea/vomit Patient not taking: No sig reported 12/31/18   ZMilton Ferguson MD  pantoprazole (PROTONIX) 20 MG tablet Take 1 tablet (20 mg total) by mouth daily. Patient not taking: No sig reported 12/31/18   ZMilton Ferguson MD  polyethylene glycol-electrolytes (TRILYTE) 420 G solution Take 4,000 mLs by mouth as directed. Patient not taking: No sig reported 03/14/13   Rourk, RCristopher Estimable MD   No Known Allergies Review of Systems  Constitutional:  Negative for activity change and appetite change.  Neurological:  Positive for speech difficulty. Negative for weakness.  Physical Exam Vitals and nursing note reviewed.  Pulmonary:     Effort: Pulmonary effort is normal.  Neurological:     General: No focal deficit present.     Mental Status: He is alert and oriented to person, place, and time.    Vital Signs: BP (!) 150/91 (BP Location: Left Arm)   Pulse 80   Temp 98.6 F (37 C) (Oral)   Resp 17   Ht 5' 10"  (1.778 m)   Wt 63.5 kg   SpO2 97%   BMI 20.09 kg/m  Pain Scale: 0-10   Pain Score: 0-No pain   SpO2: SpO2: 97 % O2 Device:SpO2: 97 % O2 Flow Rate: .   IO: Intake/output summary:  Intake/Output Summary (Last 24 hours) at 03/04/2021 1512 Last data filed at 03/04/2021 1300 Gross per 24 hour  Intake 1264.21 ml  Output 200 ml  Net 1064.21 ml    LBM:   Baseline Weight: Weight: 63.5 kg Most recent weight: Weight: 63.5 kg     Palliative Assessment/Data: PPS: 50%     Thank you for this consult. Palliative medicine will continue to follow and assist as needed.   Time In: 1416 Time Out: 1529 Time Total: 73 minutes  Greater than 50%  of this time was spent counseling and coordinating care related to the above assessment and plan.  Signed by: Mariana Kaufman, AGNP-C Palliative Medicine    Please contact Palliative Medicine Team phone at [phone] for questions and concerns.  For individual provider: See  Shea Evans

## 2021-03-04 NOTE — Progress Notes (Signed)
Palliative-   Consult received and chart reviewed.  Attempted to call patient's niece who is listed as primary contact to scheduled goals of care. Mailbox was full. SMS notification sent requesting return call to [phone].  Mariana Kaufman, AGNP-C Palliative Medicine  No charge

## 2021-03-04 NOTE — Progress Notes (Signed)
Speech Language Pathology Treatment: Dysphagia  Patient Details Name: [name] MRN: [id number] DOB: [birth year] Today's Date: 03/04/2021 Time: 7829-5621 SLP Time Calculation (min) (ACUTE ONLY): 12 min  Assessment / Plan / Recommendation Clinical Impression  Pt was provided ongoing diagnostic dysphagia treatment to determine readiness to participate in instrumental study. Pt was provided trials of thin liquids and Pt presented with wet vocal quality, oral holding, prolonged bolus prep, suspected delayed swallowing trigger and some delayed coughing. Oral suctioning was not necessary with trials of thin liquids which indicates improvement from BSE yesterday. Recommend proceed with MBSS this am. Hold diet recommendation pending results of MBSS.    HPI HPI: 80 y.o. male with medical history significant for tobacco and alcohol abuse.  Patient was sent to the ED with reports of slurred speech, and confusion.  Family also reported that patient drinks excessively, and had been falling a lot recently, also reported that patient had some mild left leg weakness. Pt with Acute CVA - bilateral anterior basal ganglia region and left superior frontal gyrus with other acute/subacute infarcts suggesting embolic event - no known history of Afib.  Neurology on site not available at [facility] today. BSE/SLE requested. He initially passed the Yale swallow screen, but is now having difficulty swallowing.      SLP Plan  MBS       Recommendations  Diet recommendations: NPO Liquids provided via: Cup;Straw Medication Administration: Via alternative means Supervision: Patient able to self feed Compensations: Minimize environmental distractions;Slow rate;Small sips/bites                Oral Care Recommendations: Oral care QID;Oral care prior to ice chip/H20;Staff/trained caregiver to provide oral care Follow up Recommendations: Skilled Nursing facility SLP Visit Diagnosis: Dysphagia, unspecified (R13.10) Plan:  MBS       Kiam Bransfield H. Romie Levee, CCC-SLP Speech Language Pathologist    Georgetta Haber 03/04/2021, 1:45 PM

## 2021-03-05 LAB — CBC WITH DIFFERENTIAL/PLATELET
Abs Immature Granulocytes: 0.04 10*3/uL (ref 0.00–0.07)
Basophils Absolute: 0.1 10*3/uL (ref 0.0–0.1)
Basophils Relative: 1 %
Eosinophils Absolute: 0 10*3/uL (ref 0.0–0.5)
Eosinophils Relative: 0 %
HCT: 43.9 % (ref 39.0–52.0)
Hemoglobin: 14 g/dL (ref 13.0–17.0)
Immature Granulocytes: 0 %
Lymphocytes Relative: 16 %
Lymphs Abs: 1.6 10*3/uL (ref 0.7–4.0)
MCH: 30.3 pg (ref 26.0–34.0)
MCHC: 31.9 g/dL (ref 30.0–36.0)
MCV: 95 fL (ref 80.0–100.0)
Monocytes Absolute: 0.4 10*3/uL (ref 0.1–1.0)
Monocytes Relative: 4 %
Neutro Abs: 7.8 10*3/uL — ABNORMAL HIGH (ref 1.7–7.7)
Neutrophils Relative %: 79 %
Platelets: 176 10*3/uL (ref 150–400)
RBC: 4.62 MIL/uL (ref 4.22–5.81)
RDW: 15.6 % — ABNORMAL HIGH (ref 11.5–15.5)
WBC: 9.9 10*3/uL (ref 4.0–10.5)
nRBC: 0 % (ref 0.0–0.2)

## 2021-03-05 LAB — BASIC METABOLIC PANEL
Anion gap: 10 (ref 5–15)
BUN: 19 mg/dL (ref 8–23)
CO2: 22 mmol/L (ref 22–32)
Calcium: 8.4 mg/dL — ABNORMAL LOW (ref 8.9–10.3)
Chloride: 104 mmol/L (ref 98–111)
Creatinine, Ser: 0.8 mg/dL (ref 0.61–1.24)
GFR, Estimated: >60 mL/min (ref >60)
Glucose, Bld: 102 mg/dL — ABNORMAL HIGH (ref 70–99)
Potassium: 3.4 mmol/L — ABNORMAL LOW (ref 3.5–5.1)
Sodium: 136 mmol/L (ref 135–145)

## 2021-03-05 LAB — MAGNESIUM: Magnesium: 2 mg/dL (ref 1.7–2.4)

## 2021-03-05 LAB — VITAMIN D 25 HYDROXY (VIT D DEFICIENCY, FRACTURES): Vit D, 25-Hydroxy: 12.13 ng/mL — ABNORMAL LOW (ref 30–100)

## 2021-03-05 MED ORDER — POTASSIUM CHLORIDE CRYS ER 20 MEQ PO TBCR
40.0000 meq | EXTENDED_RELEASE_TABLET | Freq: Once | ORAL | Status: AC
  Administered 2021-03-05: 40 meq via ORAL
  Filled 2021-03-05: qty 2

## 2021-03-05 NOTE — Progress Notes (Signed)
Received referral that patient 'Niece' would like to have HCPOA/Living Will completed. During progression it was indicated that patient is not able to make his needs known. Therefore, patient is not able to complete these documents at this time.  Tod Persia, M.[facility] [phone]

## 2021-03-05 NOTE — Progress Notes (Addendum)
Physical Therapy Treatment Patient Details Name: [name] MRN: [id number] DOB: 1941-02-27 Today's Date: 03/05/2021    History of Present Illness [name] is a 80 y.o. male with medical history significant for tobacco and alcohol abuse.  Patient was sent to the ED with reports of slurred speech, and confusion.  Family also reported that patient drinks excessively, and had been falling a lot recently, also reported that patient had some mild left leg weakness.  At the time of my evaluation, patient is awake alert and oriented to person place and time, answering most questions but not all, it is hard to tell if his speech is actually slurred as he is not verbalizing much, and tone of his voice is low.  He agrees he had abnormal speech, but is not aware of any confusion.  He drinks alcoholic beverages daily, about 1 pints of gin daily.  Last drink was yesterday afternoon at about 5 PM.  He also reports a mild productive cough, but denies difficulty breathing.    PT Comments    Patient presents in bed awake and agreeable to therapy. Patient demonstrated fair bed mobility requiring min assist for transfer to EOB. Patient demonstrated fair seated balance EOB not requiring assistance to maintain neutral position. Patient required min assist for sit to stand using a RW and was limited to 5 feet of ambulation with RW limited to a few small steps at the bed side. Patient transferred to the chair concluding therapy - nursing notified. Patient will benefit from continued physical therapy in hospital and recommended venue below to increase strength, balance, endurance for safe ADLs and gait.    Follow Up Recommendations  SNF     Equipment Recommendations  None recommended by PT    Recommendations for Other Services       Precautions / Restrictions Precautions Precautions: Fall Restrictions Weight Bearing Restrictions: No    Mobility  Bed Mobility Overal bed mobility: Needs Assistance Bed  Mobility: Supine to Sit     Supine to sit: Min assist       Patient Response: Cooperative  Transfers Overall transfer level: Needs assistance Equipment used: Rolling walker (2 wheeled) Transfers: Sit to/from [facility] Sit to Stand: Min assist Stand pivot transfers: Mod assist       General transfer comment: using RW, verbal and tactile cues  Ambulation/Gait Ambulation/Gait assistance: Mod assist Gait Distance (Feet): 5 Feet Assistive device: Rolling walker (2 wheeled) Gait Pattern/deviations: Decreased step length - left;Decreased step length - right;Decreased stride length;Trunk flexed;Narrow base of support Gait velocity: decreased   General Gait Details: slow labored movements, increase time, required verbal and tactile cues for placement of L foot and moving the RW   Stairs             Wheelchair Mobility    Modified Rankin (Stroke Patients Only)       Balance Overall balance assessment: Needs assistance Sitting-balance support: Bilateral upper extremity supported;Feet supported Sitting balance-Leahy Scale: Fair Sitting balance - Comments: at EOB   Standing balance support: Bilateral upper extremity supported;During functional activity Standing balance-Leahy Scale: Poor Standing balance comment: using RW                            Cognition Arousal/Alertness: Awake/alert Behavior During Therapy: WFL for tasks assessed/performed Overall Cognitive Status: Within Functional Limits for tasks assessed  General Comments: slow to respond with slightly slurred speach      Exercises Other Exercises Other Exercises: sit to stand 2x using RW Other Exercises: standing balance with RW 5 min    General Comments        Pertinent Vitals/Pain Pain Assessment: No/denies pain    Home Living                      Prior Function            PT Goals (current goals can  now be found in the care plan section) Acute Rehab PT Goals Patient Stated Goal: return home PT Goal Formulation: With patient Time For Goal Achievement: 03/17/21 Potential to Achieve Goals: Good Progress towards PT goals: Progressing toward goals    Frequency    Min 3X/week      PT Plan      Co-evaluation              AM-PAC PT "6 Clicks" Mobility   Outcome Measure  Help needed turning from your back to your side while in a flat bed without using bedrails?: A Little Help needed moving from lying on your back to sitting on the side of a flat bed without using bedrails?: A Little Help needed moving to and from a bed to a chair (including a wheelchair)?: A Little Help needed standing up from a chair using your arms (e.g., wheelchair or bedside chair)?: A Lot Help needed to walk in hospital room?: A Lot Help needed climbing 3-5 steps with a railing? : Total 6 Click Score: 14    End of Session   Activity Tolerance: Patient tolerated treatment well;Patient limited by fatigue;Patient limited by lethargy Patient left: in chair;with call bell/phone within reach;with chair alarm set Nurse Communication: Mobility status PT Visit Diagnosis: Unsteadiness on feet (R26.81);Other abnormalities of gait and mobility (R26.89);Muscle weakness (generalized) (M62.81);Difficulty in walking, not elsewhere classified (R26.2)     Time: 4327-6147 PT Time Calculation (min) (ACUTE ONLY): 23 min  Charges:  $Therapeutic Activity: 23-37 mins                    3:53 PM, 03/05/21 Sinclair Ship SPT  3:53 PM, 03/05/21 Lonell Grandchild, MPT Physical Therapist with [facility] [phone] office [phone] mobile phone

## 2021-03-05 NOTE — Progress Notes (Signed)
This CSW visited pt in room to complete the [place] Examination (MMSE). Pt was cooperative and answered questions/completed tasks to the best of his ability. Pt total score was a 15/30. The interpretation of the MMSE with a score 0-17 is a severe cognitive impairment. The degree of impairment for a score between 10-20 (pts being 15) shows a moderate degree of impairment, a formal assessment may be helpful if there are specific clinical indications. His score also shows that for day-to-day functioning there is a clear impairment, possibly requiring 24-hour supervision.

## 2021-03-05 NOTE — Plan of Care (Signed)
  Problem: Education: Goal: Knowledge of General Education information will improve Description: Including pain rating scale, medication(s)/side effects and non-pharmacologic comfort measures Outcome: Not Progressing   Problem: Health Behavior/Discharge Planning: Goal: Ability to manage health-related needs will improve Outcome: Not Progressing   Problem: Clinical Measurements: Goal: Ability to maintain clinical measurements within normal limits will improve Outcome: Not Progressing Goal: Will remain free from infection Outcome: Progressing Goal: Diagnostic test results will improve Outcome: Progressing Goal: Respiratory complications will improve Outcome: Progressing Goal: Cardiovascular complication will be avoided Outcome: Not Progressing   Problem: Activity: Goal: Risk for activity intolerance will decrease Outcome: Progressing   Problem: Nutrition: Goal: Adequate nutrition will be maintained Outcome: Progressing   Problem: Coping: Goal: Level of anxiety will decrease Outcome: Progressing   Problem: Elimination: Goal: Will not experience complications related to bowel motility Outcome: Progressing Goal: Will not experience complications related to urinary retention Outcome: Progressing   Problem: Pain Managment: Goal: General experience of comfort will improve Outcome: Progressing   Problem: Safety: Goal: Ability to remain free from injury will improve Outcome: Progressing   Problem: Skin Integrity: Goal: Risk for impaired skin integrity will decrease Outcome: Progressing   Problem: Education: Goal: Knowledge of disease or condition will improve Outcome: Not Progressing Goal: Knowledge of secondary prevention will improve Outcome: Not Progressing Goal: Knowledge of patient specific risk factors addressed and post discharge goals established will improve Outcome: Not Progressing Goal: Individualized Educational Video(s) Outcome: Progressing   Problem:  Coping: Goal: Will verbalize positive feelings about self Outcome: Not Progressing Goal: Will identify appropriate support needs Outcome: Not Progressing   Problem: Self-Care: Goal: Ability to participate in self-care as condition permits will improve Outcome: Not Progressing Goal: Ability to communicate needs accurately will improve Outcome: Not Progressing   Problem: Nutrition: Goal: Risk of aspiration will decrease Outcome: Progressing Goal: Dietary intake will improve Outcome: Progressing   Problem: Ischemic Stroke/TIA Tissue Perfusion: Goal: Complications of ischemic stroke/TIA will be minimized Outcome: Progressing

## 2021-03-05 NOTE — Progress Notes (Signed)
PT Cancellation Note  Patient Details Name: KINTA MARTIS MRN: [id number] DOB: [birth year]   Cancelled Treatment:    Reason Eval/Treat Not Completed: Fatigue/lethargy limiting ability to participate Pt asleep upon therapist entrance.  Able to awaken patient though very drowsy and unable to keep eyes open.  Will attempt later today if available.   Ihor Austin, LPTA/CLT; CBIS [phone]  Aldona Lento 03/05/2021, 10:37 AM

## 2021-03-05 NOTE — TOC Progression Note (Signed)
Transition of Care [facility]) - Progression Note    Patient Details  Name: [name] MRN: [id number] Date of Birth: 02/01/1941  Transition of Care [facility]) CM/SW Contact  Steven Lucks, RN Phone Number: 03/05/2021, 2:16 PM  Clinical Narrative:   TOC consulted to call DSS. TOC call [name], listed on the chart as his sister. She states they have been long time friends and he calls her sister. [name] is her daughter. She states patient does not live with them. He has in the past, but she does not want him living with them. [name] lives with her. Patient drinks heavily and they can not handle him.  [name] is wanting to control his money, she stated to pay his bills. Per [name] his landlord kicked him out, so he has not place to go.  Per MD patient does not have the mental capacity to make decisions for himself. TOC mini mental assessment to follow.       Expected Discharge Plan: [place] Barriers to Discharge: Continued Medical Work up  Expected Discharge Plan and Services Expected Discharge Plan: [place] arrangements for the past 2 months: Single Family Home                                       Social Determinants of Health (SDOH) Interventions    Readmission Risk Interventions Readmission Risk Prevention Plan 03/04/2021  Medication Screening Complete  Transportation Screening Complete  Some recent data might be hidden

## 2021-03-05 NOTE — Evaluation (Signed)
Speech Language Pathology Evaluation Patient Details Name: [name] MRN: [id number] DOB: 1941/07/26 Today's Date: 03/05/2021 Time: 4034-7425 SLP Time Calculation (min) (ACUTE ONLY): 28 min  Problem List:  Patient Active Problem List   Diagnosis Date Noted   Acute CVA (cerebrovascular accident) (HCC) 03/02/2021   Normocytic anemia 03/14/2013   Heme positive stool 03/14/2013   BACK PAIN, LUMBAR 04/17/2009   HERNIA, UMBILICAL 04/24/2008   MIXED HYPERLIPIDEMIA 08/17/2007   Alcohol abuse 05/17/2007   Essential hypertension 05/17/2007   TOBACCO ABUSE 05/02/2007   PROTEINURIA 05/02/2007   ELECTROCARDIOGRAM, ABNORMAL 05/02/2007   Past Medical History:  Past Medical History:  Diagnosis Date   ETOH abuse    Hypertension    Past Surgical History:  Past Surgical History:  Procedure Laterality Date   COLONOSCOPY  about 7-8 yrs ago   COLONOSCOPY N/A 03/27/2013   Procedure: COLONOSCOPY;  Surgeon: Corbin Ade, MD;  Location: [facility];  Service: Endoscopy;  Laterality: N/A;  10;30   ESOPHAGOGASTRODUODENOSCOPY N/A 03/27/2013   Procedure: ESOPHAGOGASTRODUODENOSCOPY (EGD);  Surgeon: Corbin Ade, MD;  Location: [facility];  Service: Endoscopy;  Laterality: N/A;   LEG SURGERY     left   HPI:  80 y.o. male with medical history significant for tobacco and alcohol abuse.  Patient was sent to the ED with reports of slurred speech, and confusion.  Family also reported that patient drinks excessively, and had been falling a lot recently, also reported that patient had some mild left leg weakness. Pt with Acute CVA - bilateral anterior basal ganglia region and left superior frontal gyrus with other acute/subacute infarcts suggesting embolic event - no known history of Afib.  Neurology on site not available at [facility] today. BSE/SLE requested. He initially passed the Yale swallow screen, but is now having difficulty swallowing.   Assessment / Plan / Recommendation Clinical Impression  Pt  presents with mild dysarthria and mod/severe cognitive impairment; Note no family is present to report cognitive baseline. Pt was assessed via portions of the [facility] SLUMS revealing significant needs in the area of memory, problem solving and executive functioning. Pt was not oriented to year or day. He further demonstrates poor awareness of his impairments and limitations reporting he "does not need help when he goes home" and that he is "ready to go home". Dysarthria is mild with decreased articulatory precision and intelligibility slightly reduced (80-100% intelligible). He will benefit from a more in depth cognitive assessment and treatment as indicated for cognition and dysarthria at discharge venue. ST will continue to follow acutely.    SLP Assessment  SLP Recommendation/Assessment: Patient needs continued Speech Lanaguage Pathology Services SLP Visit Diagnosis: Dysphagia, unspecified (R13.10);Dysarthria and anarthria (R47.1);Cognitive communication deficit (R41.841)    Follow Up Recommendations  Skilled Nursing facility    Frequency and Duration min 2x/week  1 week      SLP Evaluation Cognition  Overall Cognitive Status: No family/caregiver present to determine baseline cognitive functioning Orientation Level: Oriented to time Attention: Focused Memory: Impaired Memory Impairment: Decreased short term memory;Decreased recall of new information Awareness: Impaired Awareness Impairment: Anticipatory impairment Problem Solving: Impaired Problem Solving Impairment: Verbal basic Safety/Judgment: Impaired       Comprehension  Auditory Comprehension Overall Auditory Comprehension: Appears within functional limits for tasks assessed Yes/No Questions: Within Functional Limits Reading Comprehension Reading Status: Not tested    Expression Expression Primary Mode of Expression: Verbal   Oral / Motor  Oral Motor/Sensory Function Overall Oral Motor/Sensory Function: Generalized oral  weakness  Motor Speech Overall Motor Speech: Appears within functional limits for tasks assessed Intelligibility: Intelligibility reduced Word: 75-100% accurate Phrase: 75-100% accurate Sentence: 75-100% accurate Conversation: 75-100% accurate Motor Planning: Witnin functional limits     Francis Yardley H. Romie Levee, CCC-SLP Speech Language Pathologist          Georgetta Haber 03/05/2021, 5:30 PM

## 2021-03-05 NOTE — Progress Notes (Signed)
PROGRESS NOTE    [name]  TKZ:[id number] DOB: 22-Apr-1941 DOA: 03/02/2021 PCP: Gareth Morgan, MD   Brief Narrative:   80 y.o. male with medical history significant for tobacco and alcohol abuse.  Patient was sent to the ED with reports of slurred speech, and confusion.  Family also reported that patient drinks excessively, and had been falling a lot recently, also reported that patient had some mild left leg weakness.  At the time of my evaluation, patient is awake alert and oriented to person place and time, answering most questions but not all, it is hard to tell if his speech is actually slurred as he is not verbalizing much, and tone of his voice is low.  He agrees he had abnormal speech, but is not aware of any confusion.  He drinks alcoholic beverages daily, about 1 pints of gin daily.  Last drink was yesterday afternoon at about 5 PM.  He also reports a mild productive cough, but denies difficulty breathing.  Assessment & Plan:   Principal Problem:   Acute CVA (cerebrovascular accident) [facility]) Active Problems:   Alcohol abuse   Essential hypertension   Acute CVA - bilateral anterior basal ganglia region and left superior frontal gyrus with other acute/subacute infarcts suggesting embolic event - no known history of Afib.  Neurology on site not available at [facility] today.  I have spoken with covering neurologist Dr. Wilford Corner at [facility].  He reviewed patient's case with me including images, meds and labs and echo.  Recommendation was for patient to continue aspirin 81 mg daily.  Statin not recommended at this time due to LDL being 42 and he was on no prior statin.  He recommended a 30 day event monitor. I have called cardiology office to make arrangements for a 30 day monitor.  Pt was enrolled in [employer]. Pt will need to follow up with Dr. Gerilyn Pilgrim outpatient.  Completing stroke work up in hospital. He likely will need SNF rehab.      Capacity Evaluation   Patient does not appear  to have the capacity at this time to make an informed decision to agree to or refuse treatment.      The patient lacks capacity for the following reason(s): - Patient lacks an appreciation of the nature of his/her current circumstances or psychiatric condition and the decision to treat the condition with medication because             - Patient denies the existence of an illness requiring treatment             - Patient expresses delusions about current circumstances and home living situation              - Illness affects person's ability to appreciate how treatment might be of benefit from rehabilitation and alcohol cessation   Alcohol abuse - Pt is on strict CIWA withdrawal protocol.  He is receiving thiamine, multivitamin and folic acid.  No signs of withdrawal noted.   Dysphagia - SLP team performing barium swallow evaluation on him today.  Further recommendations to follow.  Currently on Dys 1 diet.   Essential hypertension - permissive hypertension in setting of acute CVA, start lowering BP slowly over next 1 day.   Metabolic encephalopathy - secondary to acute CVA, slowly improving with supportive care.     AKI - resolved with IV fluid hydration.   Hypernatremia - resolved, stop IVF. Recheck labs in am    DVT prophylaxis:Lovenox Code Status: DNR  Family Communication: None at bedside Disposition Plan:  Status is: Inpatient  Remains inpatient appropriate because:Altered mental status, Unsafe d/c plan, IV treatments appropriate due to intensity of illness or inability to take PO, and Inpatient level of care appropriate due to severity of illness  Dispo: The patient is from: Home              Anticipated d/c is to: SNF              Patient currently is not medically stable to d/c.   Difficult to place patient Yes    Consultants:  Neurology telephone Speech therapy OT PT Palliative  Procedures:  MRI Brain  Antimicrobials:  None   Subjective: Patient seen and  evaluated today and mumbles incoherently, but is awake and alert.  No acute events noted overnight.  Objective: Vitals:   03/04/21 1350 03/04/21 2022 03/05/21 0359 03/05/21 1334  BP: (!) 150/91 (!) 154/88 (!) 164/96 (!) 166/98  Pulse: 80 95 87 87  Resp: 17 17 20 20   Temp: 98.6 F (37 C) 99.5 F (37.5 C) 98.8 F (37.1 C) 98.5 F (36.9 C)  TempSrc: Oral Oral Oral Oral  SpO2: 97% 100% 95% 98%  Weight:      Height:        Intake/Output Summary (Last 24 hours) at 03/05/2021 1339 Last data filed at 03/05/2021 1100 Gross per 24 hour  Intake 640 ml  Output 1401 ml  Net -761 ml   Filed Weights   03/02/21 1311  Weight: 63.5 kg    Examination:  General exam: Appears calm and comfortable, mumbles incoherently Respiratory system: Clear to auscultation. Respiratory effort normal. Cardiovascular system: S1 & S2 heard, RRR.  Gastrointestinal system: Abdomen is soft Central nervous system: Alert and awake Extremities: No edema Skin: No significant lesions noted Psychiatry: Flat affect.    Data Reviewed: I have personally reviewed following labs and imaging studies  CBC: Recent Labs  Lab 03/02/21 1345 03/03/21 0327 03/04/21 0610 03/05/21 0554  WBC 5.3 6.8 11.1* 9.9  NEUTROABS 4.1  --  9.8* 7.8*  HGB 15.8 14.4 13.6 14.0  HCT 49.2 43.6 41.3 43.9  MCV 95.3 92.6 93.0 95.0  PLT 227 228 182 176   Basic Metabolic Panel: Recent Labs  Lab 03/02/21 1345 03/02/21 1603 03/03/21 0327 03/04/21 0610 03/05/21 0554  NA 140  --  139 146* 136  K 4.1  --  3.5 3.5 3.4*  CL 102  --  110 115* 104  CO2 20*  --  23 22 22   GLUCOSE 195*  --  162* 103* 102*  BUN 26*  --  20 21 19   CREATININE 1.43*  --  1.05 0.87 0.80  CALCIUM 9.4  --  8.6* 8.7* 8.4*  MG  --  1.5* 2.3 2.4 2.0  PHOS  --  2.2*  --   --   --    GFR: Estimated Creatinine Clearance: 67.2 mL/min (by C-G formula based on SCr of 0.8 mg/dL). Liver Function Tests: Recent Labs  Lab 03/02/21 1345  AST 29  ALT 22  ALKPHOS 72   BILITOT 1.6*  PROT 7.9  ALBUMIN 4.0   No results for input(s): LIPASE, AMYLASE in the last 168 hours. No results for input(s): AMMONIA in the last 168 hours. Coagulation Profile: Recent Labs  Lab 03/02/21 1345  INR 1.2   Cardiac Enzymes: No results for input(s): CKTOTAL, CKMB, CKMBINDEX, TROPONINI in the last 168 hours. BNP (last 3 results) No results for input(s):  PROBNP in the last 8760 hours. HbA1C: Recent Labs    03/03/21 0327  HGBA1C 5.6   CBG: No results for input(s): GLUCAP in the last 168 hours. Lipid Profile: Recent Labs    03/03/21 0327  CHOL 119  HDL 50  LDLCALC 42  TRIG 136  CHOLHDL 2.4   Thyroid Function Tests: No results for input(s): TSH, T4TOTAL, FREET4, T3FREE, THYROIDAB in the last 72 hours. Anemia Panel: Recent Labs    03/03/21 0327  VITAMINB12 623  FOLATE 4.2*   Sepsis Labs: No results for input(s): PROCALCITON, LATICACIDVEN in the last 168 hours.  Recent Results (from the past 240 hour(s))  Resp Panel by RT-PCR (Flu A&B, Covid) Nasopharyngeal Swab     Status: None   Collection Time: 03/02/21  2:29 PM   Specimen: Nasopharyngeal Swab; Nasopharyngeal(NP) swabs in vial transport medium  Result Value Ref Range Status   SARS Coronavirus 2 by RT PCR NEGATIVE NEGATIVE Final    Comment: (NOTE) SARS-CoV-2 target nucleic acids are NOT DETECTED.  The SARS-CoV-2 RNA is generally detectable in upper respiratory specimens during the acute phase of infection. The lowest concentration of SARS-CoV-2 viral copies this assay can detect is 138 copies/mL. A negative result does not preclude SARS-Cov-2 infection and should not be used as the sole basis for treatment or other patient management decisions. A negative result may occur with  improper specimen collection/handling, submission of specimen other than nasopharyngeal swab, presence of viral mutation(s) within the areas targeted by this assay, and inadequate number of viral copies(<138 copies/mL).  A negative result must be combined with clinical observations, patient history, and epidemiological information. The expected result is Negative.  Fact Sheet for Patients:  BloggerCourse.com  Fact Sheet for Healthcare Providers:  SeriousBroker.it  This test is no t yet approved or cleared by the [place] FDA and  has been authorized for detection and/or diagnosis of SARS-CoV-2 by FDA under an Emergency Use Authorization (EUA). This EUA will remain  in effect (meaning this test can be used) for the duration of the COVID-19 declaration under Section 564(b)(1) of the Act, 21 U.S.C.section 360bbb-3(b)(1), unless the authorization is terminated  or revoked sooner.       Influenza A by PCR NEGATIVE NEGATIVE Final   Influenza B by PCR NEGATIVE NEGATIVE Final    Comment: (NOTE) The Xpert Xpress SARS-CoV-2/FLU/RSV plus assay is intended as an aid in the diagnosis of influenza from Nasopharyngeal swab specimens and should not be used as a sole basis for treatment. Nasal washings and aspirates are unacceptable for Xpert Xpress SARS-CoV-2/FLU/RSV testing.  Fact Sheet for Patients: BloggerCourse.com  Fact Sheet for Healthcare Providers: SeriousBroker.it  This test is not yet approved or cleared by the [place] FDA and has been authorized for detection and/or diagnosis of SARS-CoV-2 by FDA under an Emergency Use Authorization (EUA). This EUA will remain in effect (meaning this test can be used) for the duration of the COVID-19 declaration under Section 564(b)(1) of the Act, 21 U.S.C. section 360bbb-3(b)(1), unless the authorization is terminated or revoked.  Performed at [facility], [address], [place], [place] [address]          Radiology Studies: DG CHEST PORT 1 VIEW  Result Date: 03/04/2021 CLINICAL DATA:  Leukocytosis. EXAM: PORTABLE CHEST 1 VIEW COMPARISON:   03/02/2021 FINDINGS: Heart size appears normal. No pleural effusion or edema. Increase interstitial markings noted within the left base, similar to previous exam. New areas of platelike atelectasis identified in the left lower lobe.  Age-indeterminate right posterolateral 6th rib fracture deformity identified. IMPRESSION: Persistent increase interstitial markings within the left lower lobe with new platelike atelectasis. Age-indeterminate right posterior 6th rib fracture. Electronically Signed   By: Signa Kell M.D.   On: 03/04/2021 13:59   DG Swallowing Func-Speech Pathology  Result Date: 03/04/2021 Formatting of this result is different from the original. Objective Swallowing Evaluation: Type of Study: MBS-Modified Barium Swallow Study  Patient Details Name: KENAN LU MRN: [id number] Date of Birth: 1941/02/16 Today's Date: 03/04/2021 Time: SLP Start Time (ACUTE ONLY): 1137 -SLP Stop Time (ACUTE ONLY): 1157 SLP Time Calculation (min) (ACUTE ONLY): 20 min Past Medical History: Past Medical History: Diagnosis Date  ETOH abuse   Hypertension  Past Surgical History: Past Surgical History: Procedure Laterality Date  COLONOSCOPY  about 7-8 yrs ago  COLONOSCOPY N/A 03/27/2013  Procedure: COLONOSCOPY;  Surgeon: Corbin Ade, MD;  Location: [facility];  Service: Endoscopy;  Laterality: N/A;  10;30  ESOPHAGOGASTRODUODENOSCOPY N/A 03/27/2013  Procedure: ESOPHAGOGASTRODUODENOSCOPY (EGD);  Surgeon: Corbin Ade, MD;  Location: [facility];  Service: Endoscopy;  Laterality: N/A;  LEG SURGERY    left HPI: 80 y.o. male with medical history significant for tobacco and alcohol abuse.  Patient was sent to the ED with reports of slurred speech, and confusion.  Family also reported that patient drinks excessively, and had been falling a lot recently, also reported that patient had some mild left leg weakness. Pt with Acute CVA - bilateral anterior basal ganglia region and left superior frontal gyrus with other  acute/subacute infarcts suggesting embolic event - no known history of Afib.  Neurology on site not available at [facility] today. BSE/SLE requested. He initially passed the Yale swallow screen, but is now having difficulty swallowing.  Subjective: "Yeah" Assessment / Plan / Recommendation CHL IP CLINICAL IMPRESSIONS 03/04/2021 Clinical Impression Pt presents with moderate/severe oral dysphagia and mild pharyngeal dysphagia; no aspiration was visualized with any consistencies or textures only flash penetration visualized with consecutive straw sips of thin liquids. Pt demonstrates prolonged oral manipulation of bolus and significant oral holding with some puree/regular presentations that required cues to "swallow". There was less oral holding with thin liquids and swallowing trigger was more timely/swift than with solid textures. With solid textures Pt with lingual pumping and delay in swallowing trigger often not triggered until majority of the bolus sits in the valleculae for 3-5 seconds. Note good laryngeal vestibule closure and airway protection across consistencies; note slightly decreased pharyngeal squeeze resulting in mild/mod vallecuale and pyriform residue across consistencies. With regular textures, after masticating presentation Pt just held bolus in his mouth until SLP provided verbal cue to swallow (~10 seconds). Bolus prep was more efficient and swift with puree, however still required mod verbal cues to "swallow". Recommend initiate puree/D1 diet with thin liquids. Recommend avoid straws secondary to decreased awareness and noted consistent penetration with thin liqudis via straw. Meds are ok whole with liquids. Pt will require 1:1 support for all meals for verbal cues and support; alternating bites and sips was somewhat effective to facilitate AP transfer of bolus. After meals and throughout meal please check Pt's mouth for residue and holding. Secondary to cognitive deficits/poor awareness of deficits and  oral holding, Pt is still at risk for malnutrition. ST will continue to follow acutely for diet tolerance. SLP Visit Diagnosis Dysphagia, unspecified (R13.10) Attention and concentration deficit following -- Frontal lobe and executive function deficit following -- Impact on safety and function Risk for inadequate nutrition/hydration;Mild aspiration  risk   CHL IP TREATMENT RECOMMENDATION 03/04/2021 Treatment Recommendations Therapy as outlined in treatment plan below   Prognosis 03/04/2021 Prognosis for Safe Diet Advancement Fair Barriers to Reach Goals Severity of deficits Barriers/Prognosis Comment -- CHL IP DIET RECOMMENDATION 03/04/2021 SLP Diet Recommendations Dysphagia 1 (Puree) solids;Thin liquid Liquid Administration via No straw;Cup Medication Administration Whole meds with liquid Compensations Minimize environmental distractions;Slow rate;Small sips/bites Postural Changes Seated upright at 90 degrees   CHL IP OTHER RECOMMENDATIONS 03/04/2021 Recommended Consults -- Oral Care Recommendations Oral care QID Other Recommendations --   CHL IP FOLLOW UP RECOMMENDATIONS 03/04/2021 Follow up Recommendations Skilled Nursing facility   [facility] IP FREQUENCY AND DURATION 03/04/2021 Speech Therapy Frequency (ACUTE ONLY) min 2x/week Treatment Duration 1 week      CHL IP ORAL PHASE 03/04/2021 Oral Phase Impaired Oral - Pudding Teaspoon -- Oral - Pudding Cup -- Oral - Honey Teaspoon -- Oral - Honey Cup -- Oral - Nectar Teaspoon -- Oral - Nectar Cup -- Oral - Nectar Straw -- Oral - Thin Teaspoon Lingual pumping;Reduced posterior propulsion Oral - Thin Cup Lingual pumping;Reduced posterior propulsion Oral - Thin Straw WFL Oral - Puree Impaired mastication;Weak lingual manipulation;Lingual pumping;Reduced posterior propulsion;Holding of bolus;Lingual/palatal residue;Piecemeal swallowing;Delayed oral transit;Decreased bolus cohesion Oral - Mech Soft NT Oral - Regular Impaired mastication;Weak lingual manipulation;Lingual pumping;Reduced  posterior propulsion;Holding of bolus;Lingual/palatal residue;Piecemeal swallowing;Delayed oral transit;Decreased bolus cohesion Oral - Multi-Consistency NT Oral - Pill -- Oral Phase - Comment --  CHL IP PHARYNGEAL PHASE 03/04/2021 Pharyngeal Phase Impaired Pharyngeal- Pudding Teaspoon -- Pharyngeal -- Pharyngeal- Pudding Cup -- Pharyngeal -- Pharyngeal- Honey Teaspoon -- Pharyngeal -- Pharyngeal- Honey Cup -- Pharyngeal -- Pharyngeal- Nectar Teaspoon -- Pharyngeal -- Pharyngeal- Nectar Cup -- Pharyngeal -- Pharyngeal- Nectar Straw -- Pharyngeal -- Pharyngeal- Thin Teaspoon Reduced pharyngeal peristalsis;Pharyngeal residue - valleculae;Pharyngeal residue - pyriform Pharyngeal Material does not enter airway Pharyngeal- Thin Cup Reduced pharyngeal peristalsis;Pharyngeal residue - valleculae;Pharyngeal residue - pyriform Pharyngeal Material does not enter airway Pharyngeal- Thin Straw Reduced pharyngeal peristalsis;Pharyngeal residue - valleculae;Pharyngeal residue - pyriform Pharyngeal Material enters airway, remains ABOVE vocal cords and not ejected out Pharyngeal- Puree Pharyngeal residue - valleculae;Pharyngeal residue - pyriform;Reduced pharyngeal peristalsis;Reduced epiglottic inversion Pharyngeal Material does not enter airway Pharyngeal- Mechanical Soft NT Pharyngeal -- Pharyngeal- Regular Delayed swallow initiation-vallecula;Reduced epiglottic inversion;Reduced pharyngeal peristalsis;Pharyngeal residue - valleculae;Pharyngeal residue - pyriform Pharyngeal Material does not enter airway Pharyngeal- Multi-consistency NT Pharyngeal -- Pharyngeal- Pill WFL Pharyngeal Material does not enter airway Pharyngeal Comment --  CHL IP CERVICAL ESOPHAGEAL PHASE 03/04/2021 Cervical Esophageal Phase WFL Pudding Teaspoon -- Pudding Cup -- Honey Teaspoon -- Honey Cup -- Nectar Teaspoon -- Nectar Cup -- Nectar Straw -- Thin Teaspoon -- Thin Cup -- Thin Straw -- Puree -- Mechanical Soft -- Regular -- Multi-consistency -- Pill --  Cervical Esophageal Comment -- Georgetta Haber 03/04/2021, 2:25 PM                   Scheduled Meds:  aspirin EC  81 mg Oral Daily   enoxaparin (LOVENOX) injection  40 mg Subcutaneous Q24H   folic acid  1 mg Intravenous Daily   multivitamin with minerals  1 tablet Oral Daily   thiamine  100 mg Oral Daily   Or   thiamine  100 mg Intravenous Daily     LOS: 3 days    Time spent: 35 minutes    Nkenge Sonntag Hoover Brunette, DO [facility]  If 7PM-7AM, please contact night-coverage [email or web address] 03/05/2021, 1:39 PM

## 2021-03-05 NOTE — Progress Notes (Signed)
Speech Language Pathology Treatment: Dysphagia  Patient Details Name: [name] MRN: [id number] DOB: 09/16/1940 Today's Date: 03/05/2021 Time: 3329-5188 SLP Time Calculation (min) (ACUTE ONLY): 18 min  Assessment / Plan / Recommendation Clinical Impression  Ongoing diagnostic dysphagia therapy was provided via PO trials to assess diet tolerance. RN reports consumption of breakfast and lunch tray and taking meds whole with liquids without incident today. Pt consumed pudding trials, self-fed, with oral holding requiring occasional verbal cues to "swallow". Continue to note slow and prolonged AP transit and bolus prep. He consumed thin liquids via cup with occasional wet vocal quality - verbal cues throughout trials were inconsistently required for throat clear and additional dry swallow which were effective for clearing wetness. Recommend continue with D1/puree diet and thin liquids. Meds are ok whole with liquids. ST will continue to follow,   HPI HPI: 80 y.o. male with medical history significant for tobacco and alcohol abuse.  Patient was sent to the ED with reports of slurred speech, and confusion.  Family also reported that patient drinks excessively, and had been falling a lot recently, also reported that patient had some mild left leg weakness. Pt with Acute CVA - bilateral anterior basal ganglia region and left superior frontal gyrus with other acute/subacute infarcts suggesting embolic event - no known history of Afib.  Neurology on site not available at [facility] today. BSE/SLE requested. He initially passed the Yale swallow screen, but is now having difficulty swallowing.      SLP Plan          Recommendations  Diet recommendations: Thin liquid;Dysphagia 1 (puree) Liquids provided via: Cup;Straw Medication Administration: Whole meds with liquid Supervision: Patient able to self feed Compensations: Minimize environmental distractions;Slow rate;Small sips/bites Postural Changes and/or  Swallow Maneuvers: Out of bed for meals                Oral Care Recommendations: Oral care BID Follow up Recommendations: Skilled Nursing facility SLP Visit Diagnosis: Dysphagia, unspecified (R13.10)       Erlean Mealor H. Romie Levee, CCC-SLP Speech Language Pathologist    Georgetta Haber 03/05/2021, 4:52 PM

## 2021-03-06 LAB — BASIC METABOLIC PANEL
Anion gap: 9 (ref 5–15)
BUN: 19 mg/dL (ref 8–23)
CO2: 21 mmol/L — ABNORMAL LOW (ref 22–32)
Calcium: 8.7 mg/dL — ABNORMAL LOW (ref 8.9–10.3)
Chloride: 107 mmol/L (ref 98–111)
Creatinine, Ser: 0.77 mg/dL (ref 0.61–1.24)
GFR, Estimated: >60 mL/min (ref >60)
Glucose, Bld: 109 mg/dL — ABNORMAL HIGH (ref 70–99)
Potassium: 3.9 mmol/L (ref 3.5–5.1)
Sodium: 137 mmol/L (ref 135–145)

## 2021-03-06 LAB — RESP PANEL BY RT-PCR (FLU A&B, COVID) ARPGX2
Influenza A by PCR: NEGATIVE
Influenza B by PCR: NEGATIVE
SARS Coronavirus 2 by RT PCR: NEGATIVE

## 2021-03-06 LAB — MAGNESIUM: Magnesium: 1.9 mg/dL (ref 1.7–2.4)

## 2021-03-06 MED ORDER — ASPIRIN 81 MG PO TBEC: 81.0000 mg | DELAYED_RELEASE_TABLET | Freq: Every day | ORAL | 11 refills | Status: AC

## 2021-03-06 MED ORDER — LOSARTAN POTASSIUM 100 MG PO TABS: 100.0000 mg | ORAL_TABLET | Freq: Every day | ORAL | 0 refills | Status: AC

## 2021-03-06 MED ORDER — LOSARTAN POTASSIUM 50 MG PO TABS
100.0000 mg | ORAL_TABLET | Freq: Every day | ORAL | Status: DC
  Administered 2021-03-06: 100 mg via ORAL
  Filled 2021-03-06: qty 2

## 2021-03-06 NOTE — Care Management Important Message (Signed)
Important Message  Patient Details  Name: [name] MRN: [id number] Date of Birth: Apr 23, 1941   Medicare Important Message Given:  Yes     [name] 03/06/2021, 3:36 PM

## 2021-03-06 NOTE — Discharge Summary (Signed)
Physician Discharge Summary  [name] KYH:[id number] DOB: 03-09-1941 DOA: 03/02/2021  PCP: [name], MD  Admit date: 03/02/2021  Discharge date: 03/06/2021  Admitted From:Home  Disposition:  SNF  Recommendations for Outpatient Follow-up:  Follow up with PCP in 1-2 weeks Follow-up with Dr. Merlene Laughter with neurology in the next 4 weeks Continue cardiac event monitor for the next 30 days Continue ASA daily as prescribed, no need for statin due to low LDL Resume home losartan as ordered  Home Health: None  Equipment/Devices: None  Discharge Condition:Stable  CODE STATUS: DNR  Diet recommendation: Dysphagia 1 diet  Brief/Interim Summary: Per HPI: 80 y.o. male with medical history significant for tobacco and alcohol abuse.  Patient was sent to the ED with reports of slurred speech, and confusion.  Family also reported that patient drinks excessively, and had been falling a lot recently, also reported that patient had some mild left leg weakness.  At the time of my evaluation, patient is awake alert and oriented to person place and time, answering most questions but not all, it is hard to tell if his speech is actually slurred as he is not verbalizing much, and tone of his voice is low.  He agrees he had abnormal speech, but is not aware of any confusion.  He drinks alcoholic beverages daily, about 1 pints of gin daily.  Last drink was yesterday afternoon at about 5 PM.  He also reports a mild productive cough, but denies difficulty breathing.   Patient was admitted with acute CVA to the bilateral anterior basal ganglia region in the left superior frontal gyrus.  He has areas of others acute/subacute infarcts suggesting embolic event with no known history of atrial fibrillation.  Neurology has recommended aspirin daily, but no further statin given low LDL.  He was also recommended a 30-day event monitor which has been ordered.  Unfortunately, there was some concern of decision-making  capacity during his stay, but he has been reevaluated and noted to have capacity and is agreeable to SNF which was recommended by PT.  His encephalopathic state has gradually improved and he is now stable for discharge.  Discharge Diagnoses:  Principal Problem:   Acute CVA (cerebrovascular accident) [facility]) Active Problems:   Alcohol abuse   Essential hypertension  Principal discharge diagnosis: Acute likely embolic CVA.  Discharge Instructions  Discharge Instructions     Diet - low sodium heart healthy   Complete by: As directed    Increase activity slowly   Complete by: As directed       Allergies as of 03/06/2021   No Known Allergies      Medication List     STOP taking these medications    amoxicillin-clarithromycin-lansoprazole combo pack Commonly known as: PREVPAC   losartan-hydrochlorothiazide 100-25 MG tablet Commonly known as: HYZAAR   nabumetone 750 MG tablet Commonly known as: RELAFEN   ondansetron 4 MG disintegrating tablet Commonly known as: Zofran ODT   pantoprazole 20 MG tablet Commonly known as: PROTONIX   polyethylene glycol-electrolytes 420 g solution Commonly known as: TriLyte       TAKE these medications    aspirin 81 MG EC tablet Take 1 tablet (81 mg total) by mouth daily. Swallow whole. Start taking on: March 07, 2021   losartan 100 MG tablet Commonly known as: COZAAR Take 1 tablet (100 mg total) by mouth daily. Start taking on: March 07, 2021        Follow-up Information     [name], MD.  Schedule an appointment as soon as possible for a visit in 1 week(s).   Specialty: Family Medicine Contact information: [place] [address] [phone]                No Known Allergies  Consultations: Discussed with neurology Palliative care   Procedures/Studies: DG Chest 1 View  Result Date: 03/02/2021 CLINICAL DATA:  Stroke-like symptoms. Altered mental status. Smoker. EXAM: CHEST  1 VIEW  COMPARISON:  09/26/2011. FINDINGS: Midline trachea. Normal heart size. Atherosclerosis in the transverse aorta. No pleural effusion or pneumothorax. Basilar and peripheral predominant interstitial thickening is greater left than right. Given differences in technique, relatively similar to 2013. IMPRESSION: Left greater than right basilar predominant interstitial opacities are similar to 2013. Possibly related to chronic bronchitis/smoking. Left lower lobe pneumonia cannot be entirely excluded. Interstitial lung disease is possible, but the lung bases on the 12/31/2018 CT appear relatively uninvolved. Electronically Signed   By: Abigail Miyamoto M.D.   On: 03/02/2021 14:44   CT Head Wo Contrast  Result Date: 03/02/2021 CLINICAL DATA:  80 year old male with altered mental status. EXAM: CT HEAD WITHOUT CONTRAST TECHNIQUE: Contiguous axial images were obtained from the base of the skull through the vertex without intravenous contrast. COMPARISON:  10/24/2020 CT FINDINGS: Brain: A new RIGHT caudate/basal ganglia infarct is noted since 10/24/2020. Atrophy, chronic small-vessel white matter ischemic changes and remote LEFT basal ganglia infarcts again noted. There is no evidence of hemorrhage, extra-axial collection, mass lesion, midline shift or hydrocephalus. Vascular: Carotid atherosclerotic calcifications are noted. Skull: Normal. Negative for fracture or focal lesion. Sinuses/Orbits: No acute finding. Other: None IMPRESSION: 1. New RIGHT caudate/basal ganglia infarct since 10/24/2020 - likely acute to subacute. No evidence of hemorrhage. 2. Atrophy, chronic small-vessel white matter ischemic changes and remote LEFT basal ganglia infarcts. Electronically Signed   By: Margarette Canada M.D.   On: 03/02/2021 14:22   MR BRAIN WO CONTRAST  Result Date: 03/03/2021 CLINICAL DATA:  Stroke follow-up. Slurred speech, left-sided weakness. EXAM: MRI HEAD WITHOUT CONTRAST TECHNIQUE: Multiplanar, multiecho pulse sequences of the  brain and surrounding structures were obtained without intravenous contrast. COMPARISON:  Head CT March 02, 2021. FINDINGS: Brain: Foci of restricted diffusion are seen in the bilateral anterior basal ganglia region, measuring approximately 18 mm on the right and 8 mm on the left, consistent with acute/subacute infarct. A punctate focus of restricted diffusion is also seen in the left superior frontal gyrus. No hemorrhage, hydrocephalus, extra-axial collection or mass lesion. Small remote infarcts are seen in the left cerebellar hemisphere, thalami, bilateral basal ganglia region, left corona radiata and right centrum semiovale. Scattered and confluent foci of T2 hyperintensity are seen within the white matter of the cerebral hemispheres, nonspecific, most likely related to chronic small vessel ischemia. Moderate parenchymal volume loss. Vascular: Normal flow voids. Skull and upper cervical spine: Normal marrow signal. Sinuses/Orbits: Negative. Other: None. IMPRESSION: 1. Bilateral anterior basal ganglia region and left superior frontal gyrus acute/subacute infarcts. 2. Remote small infarcts in the left cerebellar hemisphere, thalami, bilateral basal ganglia region, left corona radiata and right centrum semiovale. 3. Mild chronic microvascular ischemic changes of the white matter and moderate parenchymal volume loss. Electronically Signed   By: Pedro Earls M.D.   On: 03/03/2021 12:35   DG CHEST PORT 1 VIEW  Result Date: 03/04/2021 CLINICAL DATA:  Leukocytosis. EXAM: PORTABLE CHEST 1 VIEW COMPARISON:  03/02/2021 FINDINGS: Heart size appears normal. No pleural effusion or edema. Increase interstitial markings noted  within the left base, similar to previous exam. New areas of platelike atelectasis identified in the left lower lobe. Age-indeterminate right posterolateral 6th rib fracture deformity identified. IMPRESSION: Persistent increase interstitial markings within the left lower lobe with new  platelike atelectasis. Age-indeterminate right posterior 6th rib fracture. Electronically Signed   By: Kerby Moors M.D.   On: 03/04/2021 13:59   DG Swallowing Func-Speech Pathology  Result Date: 03/04/2021 Formatting of this result is different from the original. Objective Swallowing Evaluation: Type of Study: MBS-Modified Barium Swallow Study  Patient Details Name: DEMORRIS CHOYCE MRN: [id number] Date of Birth: 1941/08/14 Today's Date: 03/04/2021 Time: SLP Start Time (ACUTE ONLY): 1137 -SLP Stop Time (ACUTE ONLY): 1157 SLP Time Calculation (min) (ACUTE ONLY): 20 min Past Medical History: Past Medical History: Diagnosis Date  ETOH abuse   Hypertension  Past Surgical History: Past Surgical History: Procedure Laterality Date  COLONOSCOPY  about 7-8 yrs ago  COLONOSCOPY N/A 03/27/2013  Procedure: COLONOSCOPY;  Surgeon: Daneil Dolin, MD;  Location: [facility];  Service: Endoscopy;  Laterality: N/A;  10;30  ESOPHAGOGASTRODUODENOSCOPY N/A 03/27/2013  Procedure: ESOPHAGOGASTRODUODENOSCOPY (EGD);  Surgeon: Daneil Dolin, MD;  Location: [facility];  Service: Endoscopy;  Laterality: N/A;  LEG SURGERY    left HPI: 80 y.o. male with medical history significant for tobacco and alcohol abuse.  Patient was sent to the ED with reports of slurred speech, and confusion.  Family also reported that patient drinks excessively, and had been falling a lot recently, also reported that patient had some mild left leg weakness. Pt with Acute CVA - bilateral anterior basal ganglia region and left superior frontal gyrus with other acute/subacute infarcts suggesting embolic event - no known history of Afib.  Neurology on site not available at [facility] today. BSE/SLE requested. He initially passed the Yale swallow screen, but is now having difficulty swallowing.  Subjective: "Yeah" Assessment / Plan / Recommendation CHL IP CLINICAL IMPRESSIONS 03/04/2021 Clinical Impression Pt presents with moderate/severe oral dysphagia and mild pharyngeal  dysphagia; no aspiration was visualized with any consistencies or textures only flash penetration visualized with consecutive straw sips of thin liquids. Pt demonstrates prolonged oral manipulation of bolus and significant oral holding with some puree/regular presentations that required cues to "swallow". There was less oral holding with thin liquids and swallowing trigger was more timely/swift than with solid textures. With solid textures Pt with lingual pumping and delay in swallowing trigger often not triggered until majority of the bolus sits in the valleculae for 3-5 seconds. Note good laryngeal vestibule closure and airway protection across consistencies; note slightly decreased pharyngeal squeeze resulting in mild/mod vallecuale and pyriform residue across consistencies. With regular textures, after masticating presentation Pt just held bolus in his mouth until SLP provided verbal cue to swallow (~10 seconds). Bolus prep was more efficient and swift with puree, however still required mod verbal cues to "swallow". Recommend initiate puree/D1 diet with thin liquids. Recommend avoid straws secondary to decreased awareness and noted consistent penetration with thin liqudis via straw. Meds are ok whole with liquids. Pt will require 1:1 support for all meals for verbal cues and support; alternating bites and sips was somewhat effective to facilitate AP transfer of bolus. After meals and throughout meal please check Pt's mouth for residue and holding. Secondary to cognitive deficits/poor awareness of deficits and oral holding, Pt is still at risk for malnutrition. ST will continue to follow acutely for diet tolerance. SLP Visit Diagnosis Dysphagia, unspecified (R13.10) Attention and concentration deficit following --  Frontal lobe and executive function deficit following -- Impact on safety and function Risk for inadequate nutrition/hydration;Mild aspiration risk   CHL IP TREATMENT RECOMMENDATION 03/04/2021 Treatment  Recommendations Therapy as outlined in treatment plan below   Prognosis 03/04/2021 Prognosis for Safe Diet Advancement Fair Barriers to Reach Goals Severity of deficits Barriers/Prognosis Comment -- CHL IP DIET RECOMMENDATION 03/04/2021 SLP Diet Recommendations Dysphagia 1 (Puree) solids;Thin liquid Liquid Administration via No straw;Cup Medication Administration Whole meds with liquid Compensations Minimize environmental distractions;Slow rate;Small sips/bites Postural Changes Seated upright at 90 degrees   CHL IP OTHER RECOMMENDATIONS 03/04/2021 Recommended Consults -- Oral Care Recommendations Oral care QID Other Recommendations --   CHL IP FOLLOW UP RECOMMENDATIONS 03/04/2021 Follow up Recommendations Skilled Nursing facility   [facility] IP FREQUENCY AND DURATION 03/04/2021 Speech Therapy Frequency (ACUTE ONLY) min 2x/week Treatment Duration 1 week      CHL IP ORAL PHASE 03/04/2021 Oral Phase Impaired Oral - Pudding Teaspoon -- Oral - Pudding Cup -- Oral - Honey Teaspoon -- Oral - Honey Cup -- Oral - Nectar Teaspoon -- Oral - Nectar Cup -- Oral - Nectar Straw -- Oral - Thin Teaspoon Lingual pumping;Reduced posterior propulsion Oral - Thin Cup Lingual pumping;Reduced posterior propulsion Oral - Thin Straw WFL Oral - Puree Impaired mastication;Weak lingual manipulation;Lingual pumping;Reduced posterior propulsion;Holding of bolus;Lingual/palatal residue;Piecemeal swallowing;Delayed oral transit;Decreased bolus cohesion Oral - Mech Soft NT Oral - Regular Impaired mastication;Weak lingual manipulation;Lingual pumping;Reduced posterior propulsion;Holding of bolus;Lingual/palatal residue;Piecemeal swallowing;Delayed oral transit;Decreased bolus cohesion Oral - Multi-Consistency NT Oral - Pill -- Oral Phase - Comment --  CHL IP PHARYNGEAL PHASE 03/04/2021 Pharyngeal Phase Impaired Pharyngeal- Pudding Teaspoon -- Pharyngeal -- Pharyngeal- Pudding Cup -- Pharyngeal -- Pharyngeal- Honey Teaspoon -- Pharyngeal -- Pharyngeal- Honey Cup  -- Pharyngeal -- Pharyngeal- Nectar Teaspoon -- Pharyngeal -- Pharyngeal- Nectar Cup -- Pharyngeal -- Pharyngeal- Nectar Straw -- Pharyngeal -- Pharyngeal- Thin Teaspoon Reduced pharyngeal peristalsis;Pharyngeal residue - valleculae;Pharyngeal residue - pyriform Pharyngeal Material does not enter airway Pharyngeal- Thin Cup Reduced pharyngeal peristalsis;Pharyngeal residue - valleculae;Pharyngeal residue - pyriform Pharyngeal Material does not enter airway Pharyngeal- Thin Straw Reduced pharyngeal peristalsis;Pharyngeal residue - valleculae;Pharyngeal residue - pyriform Pharyngeal Material enters airway, remains ABOVE vocal cords and not ejected out Pharyngeal- Puree Pharyngeal residue - valleculae;Pharyngeal residue - pyriform;Reduced pharyngeal peristalsis;Reduced epiglottic inversion Pharyngeal Material does not enter airway Pharyngeal- Mechanical Soft NT Pharyngeal -- Pharyngeal- Regular Delayed swallow initiation-vallecula;Reduced epiglottic inversion;Reduced pharyngeal peristalsis;Pharyngeal residue - valleculae;Pharyngeal residue - pyriform Pharyngeal Material does not enter airway Pharyngeal- Multi-consistency NT Pharyngeal -- Pharyngeal- Pill WFL Pharyngeal Material does not enter airway Pharyngeal Comment --  CHL IP CERVICAL ESOPHAGEAL PHASE 03/04/2021 Cervical Esophageal Phase WFL Pudding Teaspoon -- Pudding Cup -- Honey Teaspoon -- Honey Cup -- Nectar Teaspoon -- Nectar Cup -- Nectar Straw -- Thin Teaspoon -- Thin Cup -- Thin Straw -- Puree -- Mechanical Soft -- Regular -- Multi-consistency -- Pill -- Cervical Esophageal Comment -- Wende Bushy 03/04/2021, 2:25 PM              ECHOCARDIOGRAM COMPLETE  Result Date: 03/03/2021    ECHOCARDIOGRAM REPORT   Patient Name:   CID AGENA Date of Exam: 03/03/2021 Medical Rec #:  [id number]      Height:       70.0 in Accession #:    [id number]     Weight:       140.0 lb Date of Birth:  05/25/1941       BSA:  1.794 m Patient Age:    54 years        BP:           132/62 mmHg Patient Gender: M              HR:           90 bpm. Exam Location:  [facility] Procedure: 2D Echo, Cardiac Doppler and Color Doppler Indications:    CVA  History:        Patient has no prior history of Echocardiogram examinations.                 Risk Factors:Current Smoker and Hypertension. Alcohol abuse.  Sonographer:    Dustin Flock RDCS Referring Phys: [place]  1. Left ventricular ejection fraction, by estimation, is 60 to 65%. The left ventricle has normal function. The left ventricle has no regional wall motion abnormalities. There is mild left ventricular hypertrophy. Left ventricular diastolic parameters are consistent with Grade I diastolic dysfunction (impaired relaxation).  2. Right ventricular systolic function is normal. The right ventricular size is normal. Tricuspid regurgitation signal is inadequate for assessing PA pressure.  3. The mitral valve is normal in structure. No evidence of mitral valve regurgitation. No evidence of mitral stenosis.  4. The aortic valve is tricuspid. Aortic valve regurgitation is not visualized. No aortic stenosis is present.  5. The inferior vena cava is normal in size with greater than 50% respiratory variability, suggesting right atrial pressure of 3 mmHg. FINDINGS  Left Ventricle: Left ventricular ejection fraction, by estimation, is 60 to 65%. The left ventricle has normal function. The left ventricle has no regional wall motion abnormalities. The left ventricular internal cavity size was normal in size. There is  mild left ventricular hypertrophy. Left ventricular diastolic parameters are consistent with Grade I diastolic dysfunction (impaired relaxation). Normal left ventricular filling pressure. Right Ventricle: The right ventricular size is normal. No increase in right ventricular wall thickness. Right ventricular systolic function is normal. Tricuspid regurgitation signal is inadequate for assessing PA  pressure. Left Atrium: Left atrial size was normal in size. Right Atrium: Right atrial size was normal in size. Pericardium: There is no evidence of pericardial effusion. Mitral Valve: The mitral valve is normal in structure. No evidence of mitral valve regurgitation. No evidence of mitral valve stenosis. Tricuspid Valve: The tricuspid valve is normal in structure. Tricuspid valve regurgitation is not demonstrated. No evidence of tricuspid stenosis. Aortic Valve: The aortic valve is tricuspid. Aortic valve regurgitation is not visualized. No aortic stenosis is present. Aortic valve mean gradient measures 3.9 mmHg. Aortic valve peak gradient measures 6.8 mmHg. Aortic valve area, by VTI measures 5.13 cm. Pulmonic Valve: The pulmonic valve was not well visualized. Pulmonic valve regurgitation is not visualized. No evidence of pulmonic stenosis. Aorta: The aortic root is normal in size and structure. Venous: The inferior vena cava is normal in size with greater than 50% respiratory variability, suggesting right atrial pressure of 3 mmHg. IAS/Shunts: No atrial level shunt detected by color flow Doppler.  LEFT VENTRICLE PLAX 2D LVIDd:         3.30 cm     Diastology LVIDs:         2.10 cm     LV e' medial:    3.48 cm/s LV PW:         1.30 cm     LV E/e' medial:  14.2 LV IVS:        1.20  cm     LV e' lateral:   5.33 cm/s LVOT diam:     2.40 cm     LV E/e' lateral: 9.2 LV SV:         91 LV SV Index:   51 LVOT Area:     4.52 cm  LV Volumes (MOD) LV vol d, MOD A4C: 68.3 ml LV vol s, MOD A4C: 40.7 ml LV SV MOD A4C:     68.3 ml RIGHT VENTRICLE RV Basal diam:  2.30 cm RV S prime:     14.35 cm/s TAPSE (M-mode): 1.6 cm LEFT ATRIUM             Index       RIGHT ATRIUM           Index LA diam:        3.20 cm 1.78 cm/m  RA Area:     13.40 cm LA Vol (A2C):   45.1 ml 25.14 ml/m RA Volume:   34.90 ml  19.46 ml/m LA Vol (A4C):   34.3 ml 19.12 ml/m LA Biplane Vol: 40.5 ml 22.58 ml/m  AORTIC VALVE AV Area (Vmax):    4.24 cm AV Area  (Vmean):   3.61 cm AV Area (VTI):     5.13 cm AV Vmax:           130.31 cm/s AV Vmean:          93.659 cm/s AV VTI:            0.178 m AV Peak Grad:      6.8 mmHg AV Mean Grad:      3.9 mmHg LVOT Vmax:         122.00 cm/s LVOT Vmean:        74.800 cm/s LVOT VTI:          0.202 m LVOT/AV VTI ratio: 1.13  AORTA Ao Root diam: 2.70 cm MITRAL VALVE MV Area (PHT): 3.61 cm    SHUNTS MV Decel Time: 210 msec    Systemic VTI:  0.20 m MV E velocity: 49.30 cm/s  Systemic Diam: 2.40 cm MV A velocity: 77.10 cm/s MV E/A ratio:  0.64 Carlyle Dolly MD Electronically signed by Carlyle Dolly MD Signature Date/Time: 03/03/2021/9:14:07 AM    Final    CT ANGIO HEAD NECK W WO CM (CODE STROKE)  Result Date: 03/02/2021 CLINICAL DATA:  Stroke follow-up. EXAM: CT ANGIOGRAPHY HEAD AND NECK TECHNIQUE: Multidetector CT imaging of the head and neck was performed using the standard protocol during bolus administration of intravenous contrast. Multiplanar CT image reconstructions and MIPs were obtained to evaluate the vascular anatomy. Carotid stenosis measurements (when applicable) are obtained utilizing NASCET criteria, using the distal internal carotid diameter as the denominator. CONTRAST:  26mL OMNIPAQUE IOHEXOL 350 MG/ML SOLN COMPARISON:  Same day CT head. FINDINGS: CTA NECK FINDINGS Aortic arch: Calcific atherosclerosis of the aorta. Great vessel origins are patent. Right carotid system: Mild atherosclerosis at the carotid bifurcation without greater than 50% stenosis. Tortuous ICA. Left carotid system: Mild-to-moderate atherosclerosis at the carotid bifurcation without greater than 50% stenosis. Vertebral arteries: Right dominant. Tortuous vertebral arteries bilaterally. Mild atherosclerosis of the proximal right vertebral artery without greater than 50% stenosis. Skeleton: Moderate to severe multilevel degenerative change of the cervical spine, including disc height loss and endplate spurring. Other neck: No acute abnormality.  Upper chest: Similar biapical pleuroparenchymal scarring in comparison to remote 2013 prior. In addition, there is an irregular 6 mm pulmonary nodule in the right  upper lung with small calcification in an area that was previously not imaged. Review of the MIP images confirms the above findings CTA HEAD FINDINGS Anterior circulation: Bilateral intracranial ICA calcific atherosclerosis without evidence of greater than 50% stenosis. Bilateral MCAs are patent without proximal flow limiting stenosis. Small right A1 ACA. Bilateral ACAs are patent with severe stenosis of the left A2 ACA (series 7, image 106; series 6, image 281). No aneurysm identified. Posterior circulation: Moderate stenosis of the non dominant left intradural vertebral artery. Dominant right vertebral artery and basilar artery are patent without flow-limiting stenosis. Small right P1 PCA with prominent right posterior communicating artery, anatomic variant. Bilateral PCAs are patent without proximal flow limiting stenosis. Venous sinuses: Limited evaluation due to arterial timing without specific evidence of dural venous sinus thrombosis. Anatomic variants: See above. Review of the MIP images confirms the above findings IMPRESSION: CTA head: 1. No large vessel occlusion. 2. Severe stenosis of the left A2 ACA. 3. Moderate stenosis of the non dominant left intradural vertebral artery. CTA Neck: 1. Mild to moderate atherosclerosis in the neck without greater than 50% stenosis. 2. Approximately 6 mm irregular/linear pulmonary nodule in the right upper lung with small calcification. While this could potentially represent area of scarring, a non-contrast chest CT at 6-12 months is recommended to ensure stability. If the nodule is stable at time of repeat CT, then future CT at 18-24 months (from today's scan) is considered optional for low-risk patients, but is recommended for high-risk patients. This recommendation follows the consensus statement: Guidelines  for Management of Incidental Pulmonary Nodules Detected on CT Images: From the Fleischner Society 2017; Radiology 2017; 284:228-243. Electronically Signed   By: Margaretha Sheffield MD   On: 03/02/2021 17:05     Discharge Exam: Vitals:   03/06/21 0500 03/06/21 1321  BP: (!) 162/96 (!) 175/96  Pulse: 80 85  Resp: 14 18  Temp: 97.8 F (36.6 C) 98.6 F (37 C)  SpO2: 96% 98%   Vitals:   03/05/21 0359 03/05/21 1334 03/06/21 0500 03/06/21 1321  BP: (!) 164/96 (!) 166/98 (!) 162/96 (!) 175/96  Pulse: 87 87 80 85  Resp: 20 20 14 18   Temp: 98.8 F (37.1 C) 98.5 F (36.9 C) 97.8 F (36.6 C) 98.6 F (37 C)  TempSrc: Oral Oral Oral Oral  SpO2: 95% 98% 96% 98%  Weight:      Height:        General: Pt is alert, awake, not in acute distress Cardiovascular: RRR, S1/S2 +, no rubs, no gallops Respiratory: CTA bilaterally, no wheezing, no rhonchi Abdominal: Soft, NT, ND, bowel sounds + Extremities: no edema, no cyanosis    The results of significant diagnostics from this hospitalization (including imaging, microbiology, ancillary and laboratory) are listed below for reference.     Microbiology: Recent Results (from the past 240 hour(s))  Resp Panel by RT-PCR (Flu A&B, Covid) Nasopharyngeal Swab     Status: None   Collection Time: 03/02/21  2:29 PM   Specimen: Nasopharyngeal Swab; Nasopharyngeal(NP) swabs in vial transport medium  Result Value Ref Range Status   SARS Coronavirus 2 by RT PCR NEGATIVE NEGATIVE Final    Comment: (NOTE) SARS-CoV-2 target nucleic acids are NOT DETECTED.  The SARS-CoV-2 RNA is generally detectable in upper respiratory specimens during the acute phase of infection. The lowest concentration of SARS-CoV-2 viral copies this assay can detect is 138 copies/mL. A negative result does not preclude SARS-Cov-2 infection and should not be used as the sole basis  for treatment or other patient management decisions. A negative result may occur with  improper specimen  collection/handling, submission of specimen other than nasopharyngeal swab, presence of viral mutation(s) within the areas targeted by this assay, and inadequate number of viral copies(<138 copies/mL). A negative result must be combined with clinical observations, patient history, and epidemiological information. The expected result is Negative.  Fact Sheet for Patients:  EntrepreneurPulse.com.au  Fact Sheet for Healthcare Providers:  IncredibleEmployment.be  This test is no t yet approved or cleared by the [place] FDA and  has been authorized for detection and/or diagnosis of SARS-CoV-2 by FDA under an Emergency Use Authorization (EUA). This EUA will remain  in effect (meaning this test can be used) for the duration of the COVID-19 declaration under Section 564(b)(1) of the Act, 21 U.S.C.section 360bbb-3(b)(1), unless the authorization is terminated  or revoked sooner.       Influenza A by PCR NEGATIVE NEGATIVE Final   Influenza B by PCR NEGATIVE NEGATIVE Final    Comment: (NOTE) The Xpert Xpress SARS-CoV-2/FLU/RSV plus assay is intended as an aid in the diagnosis of influenza from Nasopharyngeal swab specimens and should not be used as a sole basis for treatment. Nasal washings and aspirates are unacceptable for Xpert Xpress SARS-CoV-2/FLU/RSV testing.  Fact Sheet for Patients: EntrepreneurPulse.com.au  Fact Sheet for Healthcare Providers: IncredibleEmployment.be  This test is not yet approved or cleared by the [place] FDA and has been authorized for detection and/or diagnosis of SARS-CoV-2 by FDA under an Emergency Use Authorization (EUA). This EUA will remain in effect (meaning this test can be used) for the duration of the COVID-19 declaration under Section 564(b)(1) of the Act, 21 U.S.C. section 360bbb-3(b)(1), unless the authorization is terminated or revoked.  Performed at [facility], [address], [place], [place] [address]      Labs: BNP (last 3 results) No results for input(s): BNP in the last 8760 hours. Basic Metabolic Panel: Recent Labs  Lab 03/02/21 1345 03/02/21 1603 03/03/21 0327 03/04/21 0610 03/05/21 0554 03/06/21 0543  NA 140  --  139 146* 136 137  K 4.1  --  3.5 3.5 3.4* 3.9  CL 102  --  110 115* 104 107  CO2 20*  --  23 22 22  21*  GLUCOSE 195*  --  162* 103* 102* 109*  BUN 26*  --  20 21 19 19   CREATININE 1.43*  --  1.05 0.87 0.80 0.77  CALCIUM 9.4  --  8.6* 8.7* 8.4* 8.7*  MG  --  1.5* 2.3 2.4 2.0 1.9  PHOS  --  2.2*  --   --   --   --    Liver Function Tests: Recent Labs  Lab 03/02/21 1345  AST 29  ALT 22  ALKPHOS 72  BILITOT 1.6*  PROT 7.9  ALBUMIN 4.0   No results for input(s): LIPASE, AMYLASE in the last 168 hours. No results for input(s): AMMONIA in the last 168 hours. CBC: Recent Labs  Lab 03/02/21 1345 03/03/21 0327 03/04/21 0610 03/05/21 0554  WBC 5.3 6.8 11.1* 9.9  NEUTROABS 4.1  --  9.8* 7.8*  HGB 15.8 14.4 13.6 14.0  HCT 49.2 43.6 41.3 43.9  MCV 95.3 92.6 93.0 95.0  PLT 227 228 182 176   Cardiac Enzymes: No results for input(s): CKTOTAL, CKMB, CKMBINDEX, TROPONINI in the last 168 hours. BNP: Invalid input(s): POCBNP CBG: No results for input(s): GLUCAP in the last 168 hours. D-Dimer No results for input(s): DDIMER  in the last 72 hours. Hgb A1c No results for input(s): HGBA1C in the last 72 hours. Lipid Profile No results for input(s): CHOL, HDL, LDLCALC, TRIG, CHOLHDL, LDLDIRECT in the last 72 hours. Thyroid function studies No results for input(s): TSH, T4TOTAL, T3FREE, THYROIDAB in the last 72 hours.  Invalid input(s): FREET3 Anemia work up No results for input(s): VITAMINB12, FOLATE, FERRITIN, TIBC, IRON, RETICCTPCT in the last 72 hours. Urinalysis    Component Value Date/Time   COLORURINE YELLOW 03/03/2021 2130   APPEARANCEUR TURBID (A) 03/03/2021 2130   LABSPEC 1.004 (L) 03/03/2021  2130   PHURINE 6.0 03/03/2021 2130   GLUCOSEU 150 (A) 03/03/2021 2130   HGBUR MODERATE (A) 03/03/2021 2130   BILIRUBINUR NEGATIVE 03/03/2021 2130   KETONESUR NEGATIVE 03/03/2021 2130   PROTEINUR NEGATIVE 03/03/2021 2130   NITRITE NEGATIVE 03/03/2021 2130   LEUKOCYTESUR NEGATIVE 03/03/2021 2130   Sepsis Labs Invalid input(s): PROCALCITONIN,  WBC,  LACTICIDVEN Microbiology Recent Results (from the past 240 hour(s))  Resp Panel by RT-PCR (Flu A&B, Covid) Nasopharyngeal Swab     Status: None   Collection Time: 03/02/21  2:29 PM   Specimen: Nasopharyngeal Swab; Nasopharyngeal(NP) swabs in vial transport medium  Result Value Ref Range Status   SARS Coronavirus 2 by RT PCR NEGATIVE NEGATIVE Final    Comment: (NOTE) SARS-CoV-2 target nucleic acids are NOT DETECTED.  The SARS-CoV-2 RNA is generally detectable in upper respiratory specimens during the acute phase of infection. The lowest concentration of SARS-CoV-2 viral copies this assay can detect is 138 copies/mL. A negative result does not preclude SARS-Cov-2 infection and should not be used as the sole basis for treatment or other patient management decisions. A negative result may occur with  improper specimen collection/handling, submission of specimen other than nasopharyngeal swab, presence of viral mutation(s) within the areas targeted by this assay, and inadequate number of viral copies(<138 copies/mL). A negative result must be combined with clinical observations, patient history, and epidemiological information. The expected result is Negative.  Fact Sheet for Patients:  EntrepreneurPulse.com.au  Fact Sheet for Healthcare Providers:  IncredibleEmployment.be  This test is no t yet approved or cleared by the [place] FDA and  has been authorized for detection and/or diagnosis of SARS-CoV-2 by FDA under an Emergency Use Authorization (EUA). This EUA will remain  in effect (meaning  this test can be used) for the duration of the COVID-19 declaration under Section 564(b)(1) of the Act, 21 U.S.C.section 360bbb-3(b)(1), unless the authorization is terminated  or revoked sooner.       Influenza A by PCR NEGATIVE NEGATIVE Final   Influenza B by PCR NEGATIVE NEGATIVE Final    Comment: (NOTE) The Xpert Xpress SARS-CoV-2/FLU/RSV plus assay is intended as an aid in the diagnosis of influenza from Nasopharyngeal swab specimens and should not be used as a sole basis for treatment. Nasal washings and aspirates are unacceptable for Xpert Xpress SARS-CoV-2/FLU/RSV testing.  Fact Sheet for Patients: EntrepreneurPulse.com.au  Fact Sheet for Healthcare Providers: IncredibleEmployment.be  This test is not yet approved or cleared by the [place] FDA and has been authorized for detection and/or diagnosis of SARS-CoV-2 by FDA under an Emergency Use Authorization (EUA). This EUA will remain in effect (meaning this test can be used) for the duration of the COVID-19 declaration under Section 564(b)(1) of the Act, 21 U.S.C. section 360bbb-3(b)(1), unless the authorization is terminated or revoked.  Performed at [facility], [address], [place], [place] [address]      Time coordinating discharge:  35 minutes  SIGNED:   Rodena Goldmann, DO [facility] 03/06/2021, 2:19 PM  If 7PM-7AM, please contact night-coverage [email or web address]

## 2021-03-06 NOTE — TOC Transition Note (Signed)
Transition of Care [facility]) - CM/SW Discharge Note   Patient Details  Name: [name] MRN: [id number] Date of Birth: 27-Sep-1940  [facility] of [facility] CM/SW Contact:  Natasha Bence, LCSW Phone Number: 03/06/2021, 3:15 PM   Clinical Narrative:    CSW notified of patient's readiness for discharge. CSW contacted [place] with DSS. Colletta [place] reported that after assessing the patient, she believed patient had capacity. CSW discussed SNF with patient again. Patient now agreeable to SNF. Patient's niece reported that patient is unvaccinated.  CSW contacted Ebony Hail with [facility] and notified Ebony Hail of patient's vaccination status. Ebony Hail agreeable to take patient on 03/06/21. CSW started [place]. Auth approved. CSW completed med necessity and called EMS. Nurse to call report. TOC signing off.   Final next level of care: Skilled Nursing Facility Barriers to Discharge: Barriers Resolved   Patient Goals and CMS Choice Patient states their goals for this hospitalization and ongoing recovery are:: rehab with SNF CMS Medicare.gov Compare Post Acute Care list provided to:: Patient Choice offered to / list presented to : Patient  Discharge Placement              Patient chooses bed at: [facility] Patient to be transferred to facility by: [facility] Name of family member notified: March Rummage (Niece)   [phone] Patient and family notified of of transfer: 03/06/21  Discharge Plan and Services                                     Social Determinants of Health (SDOH) Interventions     Readmission Risk Interventions Readmission Risk Prevention Plan 03/04/2021  Medication Screening Complete  Transportation Screening Complete  Some recent data might be hidden

## 2021-03-06 NOTE — Progress Notes (Signed)
PROGRESS NOTE    [name]  YKZ:[id number] DOB: [birth year] DOA: 03/02/2021 PCP: [name], MD   Brief Narrative:   80 y.o. male with medical history significant for tobacco and alcohol abuse.  Patient was sent to the ED with reports of slurred speech, and confusion.  Family also reported that patient drinks excessively, and had been falling a lot recently, also reported that patient had some mild left leg weakness.  At the time of my evaluation, patient is awake alert and oriented to person place and time, answering most questions but not all, it is hard to tell if his speech is actually slurred as he is not verbalizing much, and tone of his voice is low.  He agrees he had abnormal speech, but is not aware of any confusion.  He drinks alcoholic beverages daily, about 1 pints of gin daily.  Last drink was yesterday afternoon at about 5 PM.  He also reports a mild productive cough, but denies difficulty breathing.  Patient noted to have acute CVA and will require SNF placement.  TOC working with DSS to obtain guardianship.    Assessment & Plan:   Principal Problem:   Acute CVA (cerebrovascular accident) [facility]) Active Problems:   Alcohol abuse   Essential hypertension   Acute CVA - bilateral anterior basal ganglia region and left superior frontal gyrus with other acute/subacute infarcts suggesting embolic event - no known history of Afib.  Neurology on site not available at [facility] today.  I have spoken with covering neurologist Dr. Rory Percy at [facility].  He reviewed patient's case with me including images, meds and labs and echo.  Recommendation was for patient to continue aspirin 81 mg daily.  Statin not recommended at this time due to LDL being 42 and he was on no prior statin.  He recommended a 30 day event monitor. I have called cardiology office to make arrangements for a 30 day monitor.  Pt was enrolled in [employer]. Pt will need to follow up with Dr. Merlene Laughter outpatient.  Completing  stroke work up in hospital. He likely will need SNF rehab.      Capacity Evaluation   Patient does not appear to have the capacity at this time to make an informed decision to agree to or refuse treatment.      The patient lacks capacity for the following reason(s): - Patient lacks an appreciation of the nature of his/her current circumstances or psychiatric condition and the decision to treat the condition with medication because             - Patient denies the existence of an illness requiring treatment             - Patient expresses delusions about current circumstances and home living situation              - Illness affects person's ability to appreciate how treatment might be of benefit from rehabilitation and alcohol cessation   Alcohol abuse - Pt is on strict CIWA withdrawal protocol.  He is receiving thiamine, multivitamin and folic acid.  No signs of withdrawal noted.   Dysphagia - SLP team performing barium swallow evaluation on him today.  Further recommendations to follow.  Currently on Dys 1 diet.   Essential hypertension - permissive hypertension in setting of acute CVA, start lowering BP slowly starting 7/14.  Metabolic encephalopathy - stable secondary to acute CVA, slowly improving with supportive care.     AKI - resolved with IV fluid  CHEST 1 VIEW COMPARISON:  03/02/2021 FINDINGS: Heart size appears normal. No pleural effusion or edema. Increase interstitial markings noted within the left base, similar to previous exam. New areas of platelike atelectasis identified in the left lower lobe. Age-indeterminate right posterolateral 6th rib fracture deformity identified. IMPRESSION: Persistent increase interstitial markings within the left lower lobe with new platelike atelectasis. Age-indeterminate right posterior 6th rib fracture. Electronically Signed   By: Kerby Moors M.D.   On: 03/04/2021 13:59   DG Swallowing Func-Speech Pathology  Result Date: 03/04/2021 Formatting of this result is different from the original. Objective Swallowing Evaluation: Type of Study: MBS-Modified Barium Swallow Study  Patient Details Name: [name] MRN: [id number] Date of Birth: March 30, 1941 Today's Date: 03/04/2021 Time: SLP Start Time (ACUTE ONLY): 1137 -SLP Stop Time (ACUTE ONLY): 1157 SLP Time Calculation (min) (ACUTE ONLY): 20 min Past Medical History: Past Medical History: Diagnosis Date  ETOH abuse   Hypertension  Past Surgical History: Past Surgical History: Procedure Laterality Date  COLONOSCOPY  about 7-8 yrs ago  COLONOSCOPY N/A 03/27/2013  Procedure: COLONOSCOPY;  Surgeon: Daneil Dolin, MD;  Location: [facility];  Service: Endoscopy;  Laterality: N/A;  10;30  ESOPHAGOGASTRODUODENOSCOPY N/A 03/27/2013  Procedure: ESOPHAGOGASTRODUODENOSCOPY (EGD);  Surgeon: Daneil Dolin, MD;  Location: [facility];  Service: Endoscopy;  Laterality: N/A;  LEG SURGERY    left HPI: 80 y.o. male with medical history significant for tobacco and alcohol abuse.  Patient was sent to the ED with reports of slurred speech, and confusion.  Family also reported that  patient drinks excessively, and had been falling a lot recently, also reported that patient had some mild left leg weakness. Pt with Acute CVA - bilateral anterior basal ganglia region and left superior frontal gyrus with other acute/subacute infarcts suggesting embolic event - no known history of Afib.  Neurology on site not available at [facility] today. BSE/SLE requested. He initially passed the Yale swallow screen, but is now having difficulty swallowing.  Subjective: "Yeah" Assessment / Plan / Recommendation CHL IP CLINICAL IMPRESSIONS 03/04/2021 Clinical Impression Pt presents with moderate/severe oral dysphagia and mild pharyngeal dysphagia; no aspiration was visualized with any consistencies or textures only flash penetration visualized with consecutive straw sips of thin liquids. Pt demonstrates prolonged oral manipulation of bolus and significant oral holding with some puree/regular presentations that required cues to "swallow". There was less oral holding with thin liquids and swallowing trigger was more timely/swift than with solid textures. With solid textures Pt with lingual pumping and delay in swallowing trigger often not triggered until majority of the bolus sits in the valleculae for 3-5 seconds. Note good laryngeal vestibule closure and airway protection across consistencies; note slightly decreased pharyngeal squeeze resulting in mild/mod vallecuale and pyriform residue across consistencies. With regular textures, after masticating presentation Pt just held bolus in his mouth until SLP provided verbal cue to swallow (~10 seconds). Bolus prep was more efficient and swift with puree, however still required mod verbal cues to "swallow". Recommend initiate puree/D1 diet with thin liquids. Recommend avoid straws secondary to decreased awareness and noted consistent penetration with thin liqudis via straw. Meds are ok whole with liquids. Pt will require 1:1 support for all meals for verbal cues and support;  alternating bites and sips was somewhat effective to facilitate [facility] transfer of bolus. After meals and throughout meal please check Pt's mouth for residue and holding. Secondary to cognitive deficits/poor awareness of deficits and oral holding, Pt is still at risk for malnutrition.  PROGRESS NOTE    [name]  YKZ:[id number] DOB: [birth year] DOA: 03/02/2021 PCP: [name], MD   Brief Narrative:   80 y.o. male with medical history significant for tobacco and alcohol abuse.  Patient was sent to the ED with reports of slurred speech, and confusion.  Family also reported that patient drinks excessively, and had been falling a lot recently, also reported that patient had some mild left leg weakness.  At the time of my evaluation, patient is awake alert and oriented to person place and time, answering most questions but not all, it is hard to tell if his speech is actually slurred as he is not verbalizing much, and tone of his voice is low.  He agrees he had abnormal speech, but is not aware of any confusion.  He drinks alcoholic beverages daily, about 1 pints of gin daily.  Last drink was yesterday afternoon at about 5 PM.  He also reports a mild productive cough, but denies difficulty breathing.  Patient noted to have acute CVA and will require SNF placement.  TOC working with DSS to obtain guardianship.    Assessment & Plan:   Principal Problem:   Acute CVA (cerebrovascular accident) [facility]) Active Problems:   Alcohol abuse   Essential hypertension   Acute CVA - bilateral anterior basal ganglia region and left superior frontal gyrus with other acute/subacute infarcts suggesting embolic event - no known history of Afib.  Neurology on site not available at [facility] today.  I have spoken with covering neurologist Dr. Rory Percy at [facility].  He reviewed patient's case with me including images, meds and labs and echo.  Recommendation was for patient to continue aspirin 81 mg daily.  Statin not recommended at this time due to LDL being 42 and he was on no prior statin.  He recommended a 30 day event monitor. I have called cardiology office to make arrangements for a 30 day monitor.  Pt was enrolled in [employer]. Pt will need to follow up with Dr. Merlene Laughter outpatient.  Completing  stroke work up in hospital. He likely will need SNF rehab.      Capacity Evaluation   Patient does not appear to have the capacity at this time to make an informed decision to agree to or refuse treatment.      The patient lacks capacity for the following reason(s): - Patient lacks an appreciation of the nature of his/her current circumstances or psychiatric condition and the decision to treat the condition with medication because             - Patient denies the existence of an illness requiring treatment             - Patient expresses delusions about current circumstances and home living situation              - Illness affects person's ability to appreciate how treatment might be of benefit from rehabilitation and alcohol cessation   Alcohol abuse - Pt is on strict CIWA withdrawal protocol.  He is receiving thiamine, multivitamin and folic acid.  No signs of withdrawal noted.   Dysphagia - SLP team performing barium swallow evaluation on him today.  Further recommendations to follow.  Currently on Dys 1 diet.   Essential hypertension - permissive hypertension in setting of acute CVA, start lowering BP slowly starting 7/14.  Metabolic encephalopathy - stable secondary to acute CVA, slowly improving with supportive care.     AKI - resolved with IV fluid  PROGRESS NOTE    [name]  YKZ:[id number] DOB: [birth year] DOA: 03/02/2021 PCP: [name], MD   Brief Narrative:   80 y.o. male with medical history significant for tobacco and alcohol abuse.  Patient was sent to the ED with reports of slurred speech, and confusion.  Family also reported that patient drinks excessively, and had been falling a lot recently, also reported that patient had some mild left leg weakness.  At the time of my evaluation, patient is awake alert and oriented to person place and time, answering most questions but not all, it is hard to tell if his speech is actually slurred as he is not verbalizing much, and tone of his voice is low.  He agrees he had abnormal speech, but is not aware of any confusion.  He drinks alcoholic beverages daily, about 1 pints of gin daily.  Last drink was yesterday afternoon at about 5 PM.  He also reports a mild productive cough, but denies difficulty breathing.  Patient noted to have acute CVA and will require SNF placement.  TOC working with DSS to obtain guardianship.    Assessment & Plan:   Principal Problem:   Acute CVA (cerebrovascular accident) [facility]) Active Problems:   Alcohol abuse   Essential hypertension   Acute CVA - bilateral anterior basal ganglia region and left superior frontal gyrus with other acute/subacute infarcts suggesting embolic event - no known history of Afib.  Neurology on site not available at [facility] today.  I have spoken with covering neurologist Dr. Rory Percy at [facility].  He reviewed patient's case with me including images, meds and labs and echo.  Recommendation was for patient to continue aspirin 81 mg daily.  Statin not recommended at this time due to LDL being 42 and he was on no prior statin.  He recommended a 30 day event monitor. I have called cardiology office to make arrangements for a 30 day monitor.  Pt was enrolled in [employer]. Pt will need to follow up with Dr. Merlene Laughter outpatient.  Completing  stroke work up in hospital. He likely will need SNF rehab.      Capacity Evaluation   Patient does not appear to have the capacity at this time to make an informed decision to agree to or refuse treatment.      The patient lacks capacity for the following reason(s): - Patient lacks an appreciation of the nature of his/her current circumstances or psychiatric condition and the decision to treat the condition with medication because             - Patient denies the existence of an illness requiring treatment             - Patient expresses delusions about current circumstances and home living situation              - Illness affects person's ability to appreciate how treatment might be of benefit from rehabilitation and alcohol cessation   Alcohol abuse - Pt is on strict CIWA withdrawal protocol.  He is receiving thiamine, multivitamin and folic acid.  No signs of withdrawal noted.   Dysphagia - SLP team performing barium swallow evaluation on him today.  Further recommendations to follow.  Currently on Dys 1 diet.   Essential hypertension - permissive hypertension in setting of acute CVA, start lowering BP slowly starting 7/14.  Metabolic encephalopathy - stable secondary to acute CVA, slowly improving with supportive care.     AKI - resolved with IV fluid  CHEST 1 VIEW COMPARISON:  03/02/2021 FINDINGS: Heart size appears normal. No pleural effusion or edema. Increase interstitial markings noted within the left base, similar to previous exam. New areas of platelike atelectasis identified in the left lower lobe. Age-indeterminate right posterolateral 6th rib fracture deformity identified. IMPRESSION: Persistent increase interstitial markings within the left lower lobe with new platelike atelectasis. Age-indeterminate right posterior 6th rib fracture. Electronically Signed   By: Kerby Moors M.D.   On: 03/04/2021 13:59   DG Swallowing Func-Speech Pathology  Result Date: 03/04/2021 Formatting of this result is different from the original. Objective Swallowing Evaluation: Type of Study: MBS-Modified Barium Swallow Study  Patient Details Name: [name] MRN: [id number] Date of Birth: March 30, 1941 Today's Date: 03/04/2021 Time: SLP Start Time (ACUTE ONLY): 1137 -SLP Stop Time (ACUTE ONLY): 1157 SLP Time Calculation (min) (ACUTE ONLY): 20 min Past Medical History: Past Medical History: Diagnosis Date  ETOH abuse   Hypertension  Past Surgical History: Past Surgical History: Procedure Laterality Date  COLONOSCOPY  about 7-8 yrs ago  COLONOSCOPY N/A 03/27/2013  Procedure: COLONOSCOPY;  Surgeon: Daneil Dolin, MD;  Location: [facility];  Service: Endoscopy;  Laterality: N/A;  10;30  ESOPHAGOGASTRODUODENOSCOPY N/A 03/27/2013  Procedure: ESOPHAGOGASTRODUODENOSCOPY (EGD);  Surgeon: Daneil Dolin, MD;  Location: [facility];  Service: Endoscopy;  Laterality: N/A;  LEG SURGERY    left HPI: 80 y.o. male with medical history significant for tobacco and alcohol abuse.  Patient was sent to the ED with reports of slurred speech, and confusion.  Family also reported that  patient drinks excessively, and had been falling a lot recently, also reported that patient had some mild left leg weakness. Pt with Acute CVA - bilateral anterior basal ganglia region and left superior frontal gyrus with other acute/subacute infarcts suggesting embolic event - no known history of Afib.  Neurology on site not available at [facility] today. BSE/SLE requested. He initially passed the Yale swallow screen, but is now having difficulty swallowing.  Subjective: "Yeah" Assessment / Plan / Recommendation CHL IP CLINICAL IMPRESSIONS 03/04/2021 Clinical Impression Pt presents with moderate/severe oral dysphagia and mild pharyngeal dysphagia; no aspiration was visualized with any consistencies or textures only flash penetration visualized with consecutive straw sips of thin liquids. Pt demonstrates prolonged oral manipulation of bolus and significant oral holding with some puree/regular presentations that required cues to "swallow". There was less oral holding with thin liquids and swallowing trigger was more timely/swift than with solid textures. With solid textures Pt with lingual pumping and delay in swallowing trigger often not triggered until majority of the bolus sits in the valleculae for 3-5 seconds. Note good laryngeal vestibule closure and airway protection across consistencies; note slightly decreased pharyngeal squeeze resulting in mild/mod vallecuale and pyriform residue across consistencies. With regular textures, after masticating presentation Pt just held bolus in his mouth until SLP provided verbal cue to swallow (~10 seconds). Bolus prep was more efficient and swift with puree, however still required mod verbal cues to "swallow". Recommend initiate puree/D1 diet with thin liquids. Recommend avoid straws secondary to decreased awareness and noted consistent penetration with thin liqudis via straw. Meds are ok whole with liquids. Pt will require 1:1 support for all meals for verbal cues and support;  alternating bites and sips was somewhat effective to facilitate [facility] transfer of bolus. After meals and throughout meal please check Pt's mouth for residue and holding. Secondary to cognitive deficits/poor awareness of deficits and oral holding, Pt is still at risk for malnutrition.  PROGRESS NOTE    [name]  YKZ:[id number] DOB: [birth year] DOA: 03/02/2021 PCP: [name], MD   Brief Narrative:   80 y.o. male with medical history significant for tobacco and alcohol abuse.  Patient was sent to the ED with reports of slurred speech, and confusion.  Family also reported that patient drinks excessively, and had been falling a lot recently, also reported that patient had some mild left leg weakness.  At the time of my evaluation, patient is awake alert and oriented to person place and time, answering most questions but not all, it is hard to tell if his speech is actually slurred as he is not verbalizing much, and tone of his voice is low.  He agrees he had abnormal speech, but is not aware of any confusion.  He drinks alcoholic beverages daily, about 1 pints of gin daily.  Last drink was yesterday afternoon at about 5 PM.  He also reports a mild productive cough, but denies difficulty breathing.  Patient noted to have acute CVA and will require SNF placement.  TOC working with DSS to obtain guardianship.    Assessment & Plan:   Principal Problem:   Acute CVA (cerebrovascular accident) [facility]) Active Problems:   Alcohol abuse   Essential hypertension   Acute CVA - bilateral anterior basal ganglia region and left superior frontal gyrus with other acute/subacute infarcts suggesting embolic event - no known history of Afib.  Neurology on site not available at [facility] today.  I have spoken with covering neurologist Dr. Rory Percy at [facility].  He reviewed patient's case with me including images, meds and labs and echo.  Recommendation was for patient to continue aspirin 81 mg daily.  Statin not recommended at this time due to LDL being 42 and he was on no prior statin.  He recommended a 30 day event monitor. I have called cardiology office to make arrangements for a 30 day monitor.  Pt was enrolled in [employer]. Pt will need to follow up with Dr. Merlene Laughter outpatient.  Completing  stroke work up in hospital. He likely will need SNF rehab.      Capacity Evaluation   Patient does not appear to have the capacity at this time to make an informed decision to agree to or refuse treatment.      The patient lacks capacity for the following reason(s): - Patient lacks an appreciation of the nature of his/her current circumstances or psychiatric condition and the decision to treat the condition with medication because             - Patient denies the existence of an illness requiring treatment             - Patient expresses delusions about current circumstances and home living situation              - Illness affects person's ability to appreciate how treatment might be of benefit from rehabilitation and alcohol cessation   Alcohol abuse - Pt is on strict CIWA withdrawal protocol.  He is receiving thiamine, multivitamin and folic acid.  No signs of withdrawal noted.   Dysphagia - SLP team performing barium swallow evaluation on him today.  Further recommendations to follow.  Currently on Dys 1 diet.   Essential hypertension - permissive hypertension in setting of acute CVA, start lowering BP slowly starting 7/14.  Metabolic encephalopathy - stable secondary to acute CVA, slowly improving with supportive care.     AKI - resolved with IV fluid

## 2021-03-06 NOTE — Plan of Care (Signed)
Problem: Education: Goal: Knowledge of General Education information will improve Description: Including pain rating scale, medication(s)/side effects and non-pharmacologic comfort measures 03/06/2021 1603 by Melony Overly, RN Outcome: Adequate for Discharge 03/06/2021 1603 by Melony Overly, RN Outcome: Adequate for Discharge   Problem: Health Behavior/Discharge Planning: Goal: Ability to manage health-related needs will improve 03/06/2021 1603 by Melony Overly, RN Outcome: Adequate for Discharge 03/06/2021 1603 by Melony Overly, RN Outcome: Adequate for Discharge   Problem: Clinical Measurements: Goal: Ability to maintain clinical measurements within normal limits will improve 03/06/2021 1603 by Melony Overly, RN Outcome: Adequate for Discharge 03/06/2021 1603 by Melony Overly, RN Outcome: Adequate for Discharge Goal: Will remain free from infection 03/06/2021 1603 by Melony Overly, RN Outcome: Adequate for Discharge 03/06/2021 1603 by Melony Overly, RN Outcome: Adequate for Discharge Goal: Diagnostic test results will improve 03/06/2021 1603 by Melony Overly, RN Outcome: Adequate for Discharge 03/06/2021 1603 by Melony Overly, RN Outcome: Adequate for Discharge Goal: Respiratory complications will improve 03/06/2021 1603 by Melony Overly, RN Outcome: Adequate for Discharge 03/06/2021 1603 by Melony Overly, RN Outcome: Adequate for Discharge Goal: Cardiovascular complication will be avoided 03/06/2021 1603 by Melony Overly, RN Outcome: Adequate for Discharge 03/06/2021 1603 by Melony Overly, RN Outcome: Adequate for Discharge   Problem: Activity: Goal: Risk for activity intolerance will decrease 03/06/2021 1603 by Melony Overly, RN Outcome: Adequate for Discharge 03/06/2021 1603 by Melony Overly, RN Outcome: Adequate for Discharge   Problem: Nutrition: Goal: Adequate nutrition will be maintained 03/06/2021 1603 by Melony Overly, RN Outcome:  Adequate for Discharge 03/06/2021 1603 by Melony Overly, RN Outcome: Adequate for Discharge   Problem: Coping: Goal: Level of anxiety will decrease 03/06/2021 1603 by Melony Overly, RN Outcome: Adequate for Discharge 03/06/2021 1603 by Melony Overly, RN Outcome: Adequate for Discharge   Problem: Elimination: Goal: Will not experience complications related to bowel motility 03/06/2021 1603 by Melony Overly, RN Outcome: Adequate for Discharge 03/06/2021 1603 by Melony Overly, RN Outcome: Adequate for Discharge Goal: Will not experience complications related to urinary retention 03/06/2021 1603 by Melony Overly, RN Outcome: Adequate for Discharge 03/06/2021 1603 by Melony Overly, RN Outcome: Adequate for Discharge   Problem: Pain Managment: Goal: General experience of comfort will improve 03/06/2021 1603 by Melony Overly, RN Outcome: Adequate for Discharge 03/06/2021 1603 by Melony Overly, RN Outcome: Adequate for Discharge   Problem: Safety: Goal: Ability to remain free from injury will improve 03/06/2021 1603 by Melony Overly, RN Outcome: Adequate for Discharge 03/06/2021 1603 by Melony Overly, RN Outcome: Adequate for Discharge   Problem: Skin Integrity: Goal: Risk for impaired skin integrity will decrease 03/06/2021 1603 by Melony Overly, RN Outcome: Adequate for Discharge 03/06/2021 1603 by Melony Overly, RN Outcome: Adequate for Discharge   Problem: Education: Goal: Knowledge of disease or condition will improve 03/06/2021 1603 by Melony Overly, RN Outcome: Adequate for Discharge 03/06/2021 1603 by Melony Overly, RN Outcome: Adequate for Discharge Goal: Knowledge of secondary prevention will improve 03/06/2021 1603 by Melony Overly, RN Outcome: Adequate for Discharge 03/06/2021 1603 by Melony Overly, RN Outcome: Adequate for Discharge Goal: Knowledge of patient specific risk factors addressed and post discharge goals established will  improve 03/06/2021 1603 by Melony Overly, RN Outcome: Adequate for Discharge 03/06/2021 1603 by Melony Overly, RN Outcome: Adequate for Discharge Goal: Individualized Educational  Video(s) 03/06/2021 1603 by Melony Overly, RN Outcome: Adequate for Discharge 03/06/2021 1603 by Melony Overly, RN Outcome: Adequate for Discharge   Problem: Coping: Goal: Will verbalize positive feelings about self 03/06/2021 1603 by Melony Overly, RN Outcome: Adequate for Discharge 03/06/2021 1603 by Melony Overly, RN Outcome: Adequate for Discharge Goal: Will identify appropriate support needs 03/06/2021 1603 by Melony Overly, RN Outcome: Adequate for Discharge 03/06/2021 1603 by Melony Overly, RN Outcome: Adequate for Discharge   Problem: Self-Care: Goal: Ability to participate in self-care as condition permits will improve 03/06/2021 1603 by Melony Overly, RN Outcome: Adequate for Discharge 03/06/2021 1603 by Melony Overly, RN Outcome: Adequate for Discharge Goal: Ability to communicate needs accurately will improve 03/06/2021 1603 by Melony Overly, RN Outcome: Adequate for Discharge 03/06/2021 1603 by Melony Overly, RN Outcome: Adequate for Discharge   Problem: Nutrition: Goal: Risk of aspiration will decrease 03/06/2021 1603 by Melony Overly, RN Outcome: Adequate for Discharge 03/06/2021 1603 by Melony Overly, RN Outcome: Adequate for Discharge Goal: Dietary intake will improve 03/06/2021 1603 by Melony Overly, RN Outcome: Adequate for Discharge 03/06/2021 1603 by Melony Overly, RN Outcome: Adequate for Discharge   Problem: Ischemic Stroke/TIA Tissue Perfusion: Goal: Complications of ischemic stroke/TIA will be minimized 03/06/2021 1603 by Melony Overly, RN Outcome: Adequate for Discharge 03/06/2021 1603 by Melony Overly, RN Outcome: Adequate for Discharge

## 2021-03-07 NOTE — Clinical Social Work Note (Signed)
CSW notified by Ebony Hail with [facility] that cardiac monitor was not sent with patient. CSW notified MD. PA-C agreeable to provide cardiac monitor to facility. Ebony Hail notified.

## 2021-03-10 DIAGNOSIS — I10 Essential (primary) hypertension: Secondary | ICD-10-CM | POA: Diagnosis not present

## 2021-03-11 DIAGNOSIS — Z7289 Other problems related to lifestyle: Secondary | ICD-10-CM | POA: Diagnosis not present

## 2021-03-11 DIAGNOSIS — Z720 Tobacco use: Secondary | ICD-10-CM | POA: Diagnosis not present

## 2021-03-11 DIAGNOSIS — R911 Solitary pulmonary nodule: Secondary | ICD-10-CM | POA: Diagnosis not present

## 2021-03-11 DIAGNOSIS — I639 Cerebral infarction, unspecified: Secondary | ICD-10-CM | POA: Diagnosis not present

## 2021-03-11 DIAGNOSIS — I10 Essential (primary) hypertension: Secondary | ICD-10-CM | POA: Diagnosis not present

## 2021-03-11 DIAGNOSIS — R1310 Dysphagia, unspecified: Secondary | ICD-10-CM | POA: Diagnosis not present

## 2021-03-14 ENCOUNTER — Ambulatory Visit (INDEPENDENT_AMBULATORY_CARE_PROVIDER_SITE_OTHER): Payer: Medicare Other

## 2021-03-14 DIAGNOSIS — I639 Cerebral infarction, unspecified: Secondary | ICD-10-CM | POA: Diagnosis not present

## 2021-03-14 DIAGNOSIS — G464 Cerebellar stroke syndrome: Secondary | ICD-10-CM | POA: Diagnosis not present

## 2021-03-18 ENCOUNTER — Other Ambulatory Visit: Payer: Self-pay

## 2021-03-18 DIAGNOSIS — I639 Cerebral infarction, unspecified: Secondary | ICD-10-CM

## 2021-03-19 DIAGNOSIS — I639 Cerebral infarction, unspecified: Secondary | ICD-10-CM | POA: Diagnosis not present

## 2021-03-19 DIAGNOSIS — Z7289 Other problems related to lifestyle: Secondary | ICD-10-CM | POA: Diagnosis not present

## 2021-03-19 DIAGNOSIS — Z720 Tobacco use: Secondary | ICD-10-CM | POA: Diagnosis not present

## 2021-03-19 DIAGNOSIS — I10 Essential (primary) hypertension: Secondary | ICD-10-CM | POA: Diagnosis not present

## 2021-03-19 DIAGNOSIS — R1310 Dysphagia, unspecified: Secondary | ICD-10-CM | POA: Diagnosis not present

## 2021-03-19 DIAGNOSIS — R911 Solitary pulmonary nodule: Secondary | ICD-10-CM | POA: Diagnosis not present

## 2021-03-21 DIAGNOSIS — I639 Cerebral infarction, unspecified: Secondary | ICD-10-CM | POA: Diagnosis not present

## 2021-03-26 DIAGNOSIS — Z20822 Contact with and (suspected) exposure to covid-19: Secondary | ICD-10-CM | POA: Diagnosis not present

## 2021-03-26 DIAGNOSIS — L309 Dermatitis, unspecified: Secondary | ICD-10-CM | POA: Diagnosis not present

## 2021-06-03 DIAGNOSIS — R1312 Dysphagia, oropharyngeal phase: Secondary | ICD-10-CM | POA: Diagnosis not present

## 2021-06-04 DIAGNOSIS — R1312 Dysphagia, oropharyngeal phase: Secondary | ICD-10-CM | POA: Diagnosis not present

## 2021-06-05 DIAGNOSIS — R1312 Dysphagia, oropharyngeal phase: Secondary | ICD-10-CM | POA: Diagnosis not present

## 2021-06-08 DIAGNOSIS — R1312 Dysphagia, oropharyngeal phase: Secondary | ICD-10-CM | POA: Diagnosis not present

## 2021-06-09 DIAGNOSIS — M2011 Hallux valgus (acquired), right foot: Secondary | ICD-10-CM | POA: Diagnosis not present

## 2021-06-09 DIAGNOSIS — M2041 Other hammer toe(s) (acquired), right foot: Secondary | ICD-10-CM | POA: Diagnosis not present

## 2021-06-09 DIAGNOSIS — L603 Nail dystrophy: Secondary | ICD-10-CM | POA: Diagnosis not present

## 2021-06-09 DIAGNOSIS — B351 Tinea unguium: Secondary | ICD-10-CM | POA: Diagnosis not present

## 2021-06-09 DIAGNOSIS — M2042 Other hammer toe(s) (acquired), left foot: Secondary | ICD-10-CM | POA: Diagnosis not present

## 2021-06-09 DIAGNOSIS — M2012 Hallux valgus (acquired), left foot: Secondary | ICD-10-CM | POA: Diagnosis not present

## 2021-06-10 DIAGNOSIS — I10 Essential (primary) hypertension: Secondary | ICD-10-CM | POA: Diagnosis not present

## 2021-06-10 DIAGNOSIS — Z720 Tobacco use: Secondary | ICD-10-CM | POA: Diagnosis not present

## 2021-06-10 DIAGNOSIS — I639 Cerebral infarction, unspecified: Secondary | ICD-10-CM | POA: Diagnosis not present

## 2021-07-02 DIAGNOSIS — H524 Presbyopia: Secondary | ICD-10-CM | POA: Diagnosis not present

## 2021-07-22 DIAGNOSIS — I639 Cerebral infarction, unspecified: Secondary | ICD-10-CM | POA: Diagnosis not present

## 2021-07-22 DIAGNOSIS — R911 Solitary pulmonary nodule: Secondary | ICD-10-CM | POA: Diagnosis not present

## 2021-07-22 DIAGNOSIS — I10 Essential (primary) hypertension: Secondary | ICD-10-CM | POA: Diagnosis not present

## 2021-08-12 DIAGNOSIS — M79674 Pain in right toe(s): Secondary | ICD-10-CM | POA: Diagnosis not present

## 2021-08-12 DIAGNOSIS — B351 Tinea unguium: Secondary | ICD-10-CM | POA: Diagnosis not present

## 2021-08-12 DIAGNOSIS — M79675 Pain in left toe(s): Secondary | ICD-10-CM | POA: Diagnosis not present

## 2021-08-12 DIAGNOSIS — L603 Nail dystrophy: Secondary | ICD-10-CM | POA: Diagnosis not present

## 2021-08-19 DIAGNOSIS — R1310 Dysphagia, unspecified: Secondary | ICD-10-CM | POA: Diagnosis not present

## 2021-08-19 DIAGNOSIS — I672 Cerebral atherosclerosis: Secondary | ICD-10-CM | POA: Diagnosis not present

## 2021-08-19 DIAGNOSIS — R404 Transient alteration of awareness: Secondary | ICD-10-CM | POA: Diagnosis not present

## 2021-08-19 DIAGNOSIS — E46 Unspecified protein-calorie malnutrition: Secondary | ICD-10-CM | POA: Diagnosis not present

## 2021-08-19 DIAGNOSIS — Z7401 Bed confinement status: Secondary | ICD-10-CM | POA: Diagnosis not present

## 2021-08-19 DIAGNOSIS — R509 Fever, unspecified: Secondary | ICD-10-CM | POA: Diagnosis not present

## 2021-08-19 DIAGNOSIS — I69354 Hemiplegia and hemiparesis following cerebral infarction affecting left non-dominant side: Secondary | ICD-10-CM | POA: Diagnosis not present

## 2021-08-19 DIAGNOSIS — Z79899 Other long term (current) drug therapy: Secondary | ICD-10-CM | POA: Diagnosis not present

## 2021-08-19 DIAGNOSIS — Z931 Gastrostomy status: Secondary | ICD-10-CM | POA: Diagnosis not present

## 2021-08-19 DIAGNOSIS — R262 Difficulty in walking, not elsewhere classified: Secondary | ICD-10-CM | POA: Diagnosis not present

## 2021-08-19 DIAGNOSIS — M6281 Muscle weakness (generalized): Secondary | ICD-10-CM | POA: Diagnosis not present

## 2021-08-19 DIAGNOSIS — S2231XA Fracture of one rib, right side, initial encounter for closed fracture: Secondary | ICD-10-CM | POA: Diagnosis not present

## 2021-08-19 DIAGNOSIS — R279 Unspecified lack of coordination: Secondary | ICD-10-CM | POA: Diagnosis not present

## 2021-08-19 DIAGNOSIS — E869 Volume depletion, unspecified: Secondary | ICD-10-CM | POA: Diagnosis not present

## 2021-08-19 DIAGNOSIS — D649 Anemia, unspecified: Secondary | ICD-10-CM | POA: Diagnosis not present

## 2021-08-19 DIAGNOSIS — R29706 NIHSS score 6: Secondary | ICD-10-CM | POA: Diagnosis not present

## 2021-08-19 DIAGNOSIS — K9423 Gastrostomy malfunction: Secondary | ICD-10-CM | POA: Diagnosis not present

## 2021-08-19 DIAGNOSIS — Z792 Long term (current) use of antibiotics: Secondary | ICD-10-CM | POA: Diagnosis not present

## 2021-08-19 DIAGNOSIS — I63213 Cerebral infarction due to unspecified occlusion or stenosis of bilateral vertebral arteries: Secondary | ICD-10-CM | POA: Diagnosis not present

## 2021-08-19 DIAGNOSIS — Z743 Need for continuous supervision: Secondary | ICD-10-CM | POA: Diagnosis not present

## 2021-08-19 DIAGNOSIS — I700 Atherosclerosis of aorta: Secondary | ICD-10-CM | POA: Diagnosis not present

## 2021-08-19 DIAGNOSIS — H524 Presbyopia: Secondary | ICD-10-CM | POA: Diagnosis not present

## 2021-08-19 DIAGNOSIS — R4020 Unspecified coma: Secondary | ICD-10-CM | POA: Diagnosis not present

## 2021-08-19 DIAGNOSIS — J90 Pleural effusion, not elsewhere classified: Secondary | ICD-10-CM | POA: Diagnosis not present

## 2021-08-19 DIAGNOSIS — Z8673 Personal history of transient ischemic attack (TIA), and cerebral infarction without residual deficits: Secondary | ICD-10-CM | POA: Diagnosis not present

## 2021-08-19 DIAGNOSIS — Z8616 Personal history of COVID-19: Secondary | ICD-10-CM | POA: Diagnosis not present

## 2021-08-19 DIAGNOSIS — I639 Cerebral infarction, unspecified: Secondary | ICD-10-CM | POA: Diagnosis not present

## 2021-08-19 DIAGNOSIS — E876 Hypokalemia: Secondary | ICD-10-CM | POA: Diagnosis not present

## 2021-08-19 DIAGNOSIS — E440 Moderate protein-calorie malnutrition: Secondary | ICD-10-CM | POA: Diagnosis not present

## 2021-08-19 DIAGNOSIS — H2513 Age-related nuclear cataract, bilateral: Secondary | ICD-10-CM | POA: Diagnosis not present

## 2021-08-19 DIAGNOSIS — Z20822 Contact with and (suspected) exposure to covid-19: Secondary | ICD-10-CM | POA: Diagnosis not present

## 2021-08-19 DIAGNOSIS — E870 Hyperosmolality and hypernatremia: Secondary | ICD-10-CM | POA: Diagnosis not present

## 2021-08-19 DIAGNOSIS — J9811 Atelectasis: Secondary | ICD-10-CM | POA: Diagnosis not present

## 2021-08-19 DIAGNOSIS — I69391 Dysphagia following cerebral infarction: Secondary | ICD-10-CM | POA: Diagnosis not present

## 2021-08-19 DIAGNOSIS — R6339 Other feeding difficulties: Secondary | ICD-10-CM | POA: Diagnosis not present

## 2021-08-19 DIAGNOSIS — I10 Essential (primary) hypertension: Secondary | ICD-10-CM | POA: Diagnosis not present

## 2021-08-19 DIAGNOSIS — I63233 Cerebral infarction due to unspecified occlusion or stenosis of bilateral carotid arteries: Secondary | ICD-10-CM | POA: Diagnosis not present

## 2021-08-19 DIAGNOSIS — I6359 Cerebral infarction due to unspecified occlusion or stenosis of other cerebral artery: Secondary | ICD-10-CM | POA: Diagnosis not present

## 2021-08-19 DIAGNOSIS — R531 Weakness: Secondary | ICD-10-CM | POA: Diagnosis not present

## 2021-08-19 DIAGNOSIS — R633 Feeding difficulties, unspecified: Secondary | ICD-10-CM | POA: Diagnosis not present

## 2021-08-19 DIAGNOSIS — R29898 Other symptoms and signs involving the musculoskeletal system: Secondary | ICD-10-CM | POA: Diagnosis not present

## 2021-08-19 DIAGNOSIS — R059 Cough, unspecified: Secondary | ICD-10-CM | POA: Diagnosis not present

## 2021-08-19 DIAGNOSIS — Z7982 Long term (current) use of aspirin: Secondary | ICD-10-CM | POA: Diagnosis not present

## 2021-08-19 DIAGNOSIS — I6350 Cerebral infarction due to unspecified occlusion or stenosis of unspecified cerebral artery: Secondary | ICD-10-CM | POA: Diagnosis not present

## 2021-08-19 DIAGNOSIS — H18413 Arcus senilis, bilateral: Secondary | ICD-10-CM | POA: Diagnosis not present

## 2021-08-19 DIAGNOSIS — Z9981 Dependence on supplemental oxygen: Secondary | ICD-10-CM | POA: Diagnosis not present

## 2021-08-19 DIAGNOSIS — J690 Pneumonitis due to inhalation of food and vomit: Secondary | ICD-10-CM | POA: Diagnosis not present

## 2021-08-19 DIAGNOSIS — H401132 Primary open-angle glaucoma, bilateral, moderate stage: Secondary | ICD-10-CM | POA: Diagnosis not present

## 2021-08-19 DIAGNOSIS — N179 Acute kidney failure, unspecified: Secondary | ICD-10-CM | POA: Diagnosis not present

## 2021-08-19 DIAGNOSIS — I63541 Cerebral infarction due to unspecified occlusion or stenosis of right cerebellar artery: Secondary | ICD-10-CM | POA: Diagnosis not present

## 2021-08-19 DIAGNOSIS — I69320 Aphasia following cerebral infarction: Secondary | ICD-10-CM | POA: Diagnosis not present

## 2021-08-19 DIAGNOSIS — I63522 Cerebral infarction due to unspecified occlusion or stenosis of left anterior cerebral artery: Secondary | ICD-10-CM | POA: Diagnosis not present

## 2021-08-20 DIAGNOSIS — I6350 Cerebral infarction due to unspecified occlusion or stenosis of unspecified cerebral artery: Secondary | ICD-10-CM | POA: Diagnosis not present

## 2021-08-20 DIAGNOSIS — Z8673 Personal history of transient ischemic attack (TIA), and cerebral infarction without residual deficits: Secondary | ICD-10-CM | POA: Diagnosis not present

## 2021-08-20 DIAGNOSIS — I672 Cerebral atherosclerosis: Secondary | ICD-10-CM | POA: Diagnosis not present

## 2021-08-20 DIAGNOSIS — Z79899 Other long term (current) drug therapy: Secondary | ICD-10-CM | POA: Diagnosis not present

## 2021-08-20 DIAGNOSIS — D649 Anemia, unspecified: Secondary | ICD-10-CM | POA: Diagnosis not present

## 2021-08-20 DIAGNOSIS — I69391 Dysphagia following cerebral infarction: Secondary | ICD-10-CM | POA: Diagnosis not present

## 2021-08-20 DIAGNOSIS — I10 Essential (primary) hypertension: Secondary | ICD-10-CM | POA: Diagnosis not present

## 2021-08-21 DIAGNOSIS — R531 Weakness: Secondary | ICD-10-CM | POA: Diagnosis not present

## 2021-08-21 DIAGNOSIS — I10 Essential (primary) hypertension: Secondary | ICD-10-CM | POA: Diagnosis not present

## 2021-08-21 DIAGNOSIS — R1310 Dysphagia, unspecified: Secondary | ICD-10-CM | POA: Diagnosis not present

## 2021-08-21 DIAGNOSIS — I69391 Dysphagia following cerebral infarction: Secondary | ICD-10-CM | POA: Diagnosis not present

## 2021-08-21 DIAGNOSIS — D649 Anemia, unspecified: Secondary | ICD-10-CM | POA: Diagnosis not present

## 2021-08-21 DIAGNOSIS — R509 Fever, unspecified: Secondary | ICD-10-CM | POA: Diagnosis not present

## 2021-08-21 DIAGNOSIS — I6350 Cerebral infarction due to unspecified occlusion or stenosis of unspecified cerebral artery: Secondary | ICD-10-CM | POA: Diagnosis not present

## 2021-08-21 DIAGNOSIS — I672 Cerebral atherosclerosis: Secondary | ICD-10-CM | POA: Diagnosis not present

## 2021-08-21 DIAGNOSIS — I639 Cerebral infarction, unspecified: Secondary | ICD-10-CM | POA: Diagnosis not present

## 2021-08-22 DIAGNOSIS — R1310 Dysphagia, unspecified: Secondary | ICD-10-CM | POA: Diagnosis not present

## 2021-08-22 DIAGNOSIS — I10 Essential (primary) hypertension: Secondary | ICD-10-CM | POA: Diagnosis not present

## 2021-08-22 DIAGNOSIS — I69354 Hemiplegia and hemiparesis following cerebral infarction affecting left non-dominant side: Secondary | ICD-10-CM | POA: Diagnosis not present

## 2021-08-22 DIAGNOSIS — D649 Anemia, unspecified: Secondary | ICD-10-CM | POA: Diagnosis not present

## 2021-08-22 DIAGNOSIS — R6339 Other feeding difficulties: Secondary | ICD-10-CM | POA: Diagnosis not present

## 2021-08-22 DIAGNOSIS — Z792 Long term (current) use of antibiotics: Secondary | ICD-10-CM | POA: Diagnosis not present

## 2021-08-22 DIAGNOSIS — I69391 Dysphagia following cerebral infarction: Secondary | ICD-10-CM | POA: Diagnosis not present

## 2021-08-22 DIAGNOSIS — R633 Feeding difficulties, unspecified: Secondary | ICD-10-CM | POA: Diagnosis not present

## 2021-08-23 DIAGNOSIS — I639 Cerebral infarction, unspecified: Secondary | ICD-10-CM | POA: Diagnosis not present

## 2021-08-23 DIAGNOSIS — I69354 Hemiplegia and hemiparesis following cerebral infarction affecting left non-dominant side: Secondary | ICD-10-CM | POA: Diagnosis not present

## 2021-08-23 DIAGNOSIS — D649 Anemia, unspecified: Secondary | ICD-10-CM | POA: Diagnosis not present

## 2021-08-23 DIAGNOSIS — E876 Hypokalemia: Secondary | ICD-10-CM | POA: Diagnosis not present

## 2021-08-23 DIAGNOSIS — R1310 Dysphagia, unspecified: Secondary | ICD-10-CM | POA: Diagnosis not present

## 2021-08-23 DIAGNOSIS — E870 Hyperosmolality and hypernatremia: Secondary | ICD-10-CM | POA: Diagnosis not present

## 2021-08-23 DIAGNOSIS — Z792 Long term (current) use of antibiotics: Secondary | ICD-10-CM | POA: Diagnosis not present

## 2021-08-23 DIAGNOSIS — I10 Essential (primary) hypertension: Secondary | ICD-10-CM | POA: Diagnosis not present

## 2021-08-23 DIAGNOSIS — I69391 Dysphagia following cerebral infarction: Secondary | ICD-10-CM | POA: Diagnosis not present

## 2021-08-24 DIAGNOSIS — I69354 Hemiplegia and hemiparesis following cerebral infarction affecting left non-dominant side: Secondary | ICD-10-CM | POA: Diagnosis not present

## 2021-08-24 DIAGNOSIS — R1310 Dysphagia, unspecified: Secondary | ICD-10-CM | POA: Diagnosis not present

## 2021-08-24 DIAGNOSIS — Z792 Long term (current) use of antibiotics: Secondary | ICD-10-CM | POA: Diagnosis not present

## 2021-08-24 DIAGNOSIS — E876 Hypokalemia: Secondary | ICD-10-CM | POA: Diagnosis not present

## 2021-08-24 DIAGNOSIS — R509 Fever, unspecified: Secondary | ICD-10-CM | POA: Diagnosis not present

## 2021-08-24 DIAGNOSIS — I10 Essential (primary) hypertension: Secondary | ICD-10-CM | POA: Diagnosis not present

## 2021-08-24 DIAGNOSIS — I69391 Dysphagia following cerebral infarction: Secondary | ICD-10-CM | POA: Diagnosis not present

## 2021-08-24 DIAGNOSIS — I672 Cerebral atherosclerosis: Secondary | ICD-10-CM | POA: Diagnosis not present

## 2021-08-24 DIAGNOSIS — R531 Weakness: Secondary | ICD-10-CM | POA: Diagnosis not present

## 2021-08-24 DIAGNOSIS — D649 Anemia, unspecified: Secondary | ICD-10-CM | POA: Diagnosis not present

## 2021-08-25 DIAGNOSIS — I69354 Hemiplegia and hemiparesis following cerebral infarction affecting left non-dominant side: Secondary | ICD-10-CM | POA: Diagnosis not present

## 2021-08-25 DIAGNOSIS — Z792 Long term (current) use of antibiotics: Secondary | ICD-10-CM | POA: Diagnosis not present

## 2021-08-25 DIAGNOSIS — I672 Cerebral atherosclerosis: Secondary | ICD-10-CM | POA: Diagnosis not present

## 2021-08-25 DIAGNOSIS — I69391 Dysphagia following cerebral infarction: Secondary | ICD-10-CM | POA: Diagnosis not present

## 2021-08-25 DIAGNOSIS — R1310 Dysphagia, unspecified: Secondary | ICD-10-CM | POA: Diagnosis not present

## 2021-08-25 DIAGNOSIS — D649 Anemia, unspecified: Secondary | ICD-10-CM | POA: Diagnosis not present

## 2021-08-25 DIAGNOSIS — E876 Hypokalemia: Secondary | ICD-10-CM | POA: Diagnosis not present

## 2021-08-25 DIAGNOSIS — N179 Acute kidney failure, unspecified: Secondary | ICD-10-CM | POA: Diagnosis not present

## 2021-08-25 DIAGNOSIS — I10 Essential (primary) hypertension: Secondary | ICD-10-CM | POA: Diagnosis not present

## 2021-08-26 DIAGNOSIS — I700 Atherosclerosis of aorta: Secondary | ICD-10-CM | POA: Diagnosis not present

## 2021-08-26 DIAGNOSIS — I6350 Cerebral infarction due to unspecified occlusion or stenosis of unspecified cerebral artery: Secondary | ICD-10-CM | POA: Diagnosis not present

## 2021-08-26 DIAGNOSIS — J90 Pleural effusion, not elsewhere classified: Secondary | ICD-10-CM | POA: Diagnosis not present

## 2021-08-26 DIAGNOSIS — Z931 Gastrostomy status: Secondary | ICD-10-CM | POA: Diagnosis not present

## 2021-08-26 DIAGNOSIS — Z7982 Long term (current) use of aspirin: Secondary | ICD-10-CM | POA: Diagnosis not present

## 2021-08-26 DIAGNOSIS — Z79899 Other long term (current) drug therapy: Secondary | ICD-10-CM | POA: Diagnosis not present

## 2021-08-26 DIAGNOSIS — J9811 Atelectasis: Secondary | ICD-10-CM | POA: Diagnosis not present

## 2021-08-26 DIAGNOSIS — I6359 Cerebral infarction due to unspecified occlusion or stenosis of other cerebral artery: Secondary | ICD-10-CM | POA: Diagnosis not present

## 2021-08-27 DIAGNOSIS — I10 Essential (primary) hypertension: Secondary | ICD-10-CM | POA: Diagnosis not present

## 2021-08-27 DIAGNOSIS — R1310 Dysphagia, unspecified: Secondary | ICD-10-CM | POA: Diagnosis not present

## 2021-08-27 DIAGNOSIS — Z9981 Dependence on supplemental oxygen: Secondary | ICD-10-CM | POA: Diagnosis not present

## 2021-08-27 DIAGNOSIS — D649 Anemia, unspecified: Secondary | ICD-10-CM | POA: Diagnosis not present

## 2021-08-27 DIAGNOSIS — I6350 Cerebral infarction due to unspecified occlusion or stenosis of unspecified cerebral artery: Secondary | ICD-10-CM | POA: Diagnosis not present

## 2021-08-28 DIAGNOSIS — E440 Moderate protein-calorie malnutrition: Secondary | ICD-10-CM | POA: Diagnosis not present

## 2021-08-28 DIAGNOSIS — I2510 Atherosclerotic heart disease of native coronary artery without angina pectoris: Secondary | ICD-10-CM | POA: Diagnosis not present

## 2021-08-28 DIAGNOSIS — Z743 Need for continuous supervision: Secondary | ICD-10-CM | POA: Diagnosis not present

## 2021-08-28 DIAGNOSIS — M6281 Muscle weakness (generalized): Secondary | ICD-10-CM | POA: Diagnosis not present

## 2021-08-28 DIAGNOSIS — R262 Difficulty in walking, not elsewhere classified: Secondary | ICD-10-CM | POA: Diagnosis not present

## 2021-08-28 DIAGNOSIS — I69391 Dysphagia following cerebral infarction: Secondary | ICD-10-CM | POA: Diagnosis not present

## 2021-08-28 DIAGNOSIS — H401132 Primary open-angle glaucoma, bilateral, moderate stage: Secondary | ICD-10-CM | POA: Diagnosis not present

## 2021-08-28 DIAGNOSIS — R404 Transient alteration of awareness: Secondary | ICD-10-CM | POA: Diagnosis not present

## 2021-08-28 DIAGNOSIS — I10 Essential (primary) hypertension: Secondary | ICD-10-CM | POA: Diagnosis not present

## 2021-08-28 DIAGNOSIS — I672 Cerebral atherosclerosis: Secondary | ICD-10-CM | POA: Diagnosis not present

## 2021-08-28 DIAGNOSIS — I6350 Cerebral infarction due to unspecified occlusion or stenosis of unspecified cerebral artery: Secondary | ICD-10-CM | POA: Diagnosis not present

## 2021-08-28 DIAGNOSIS — I69354 Hemiplegia and hemiparesis following cerebral infarction affecting left non-dominant side: Secondary | ICD-10-CM | POA: Diagnosis not present

## 2021-08-28 DIAGNOSIS — Z8673 Personal history of transient ischemic attack (TIA), and cerebral infarction without residual deficits: Secondary | ICD-10-CM | POA: Diagnosis not present

## 2021-08-28 DIAGNOSIS — H2513 Age-related nuclear cataract, bilateral: Secondary | ICD-10-CM | POA: Diagnosis not present

## 2021-08-28 DIAGNOSIS — I69320 Aphasia following cerebral infarction: Secondary | ICD-10-CM | POA: Diagnosis not present

## 2021-08-28 DIAGNOSIS — H524 Presbyopia: Secondary | ICD-10-CM | POA: Diagnosis not present

## 2021-08-28 DIAGNOSIS — R279 Unspecified lack of coordination: Secondary | ICD-10-CM | POA: Diagnosis not present

## 2021-08-28 DIAGNOSIS — Z7401 Bed confinement status: Secondary | ICD-10-CM | POA: Diagnosis not present

## 2021-08-28 DIAGNOSIS — R1310 Dysphagia, unspecified: Secondary | ICD-10-CM | POA: Diagnosis not present

## 2021-08-28 DIAGNOSIS — H40013 Open angle with borderline findings, low risk, bilateral: Secondary | ICD-10-CM | POA: Diagnosis not present

## 2021-08-28 DIAGNOSIS — H18413 Arcus senilis, bilateral: Secondary | ICD-10-CM | POA: Diagnosis not present

## 2021-08-28 DIAGNOSIS — D649 Anemia, unspecified: Secondary | ICD-10-CM | POA: Diagnosis not present

## 2021-08-28 DIAGNOSIS — N179 Acute kidney failure, unspecified: Secondary | ICD-10-CM | POA: Diagnosis not present

## 2021-08-28 DIAGNOSIS — Z8616 Personal history of COVID-19: Secondary | ICD-10-CM | POA: Diagnosis not present

## 2021-08-28 DIAGNOSIS — R4020 Unspecified coma: Secondary | ICD-10-CM | POA: Diagnosis not present

## 2021-09-17 DIAGNOSIS — H18413 Arcus senilis, bilateral: Secondary | ICD-10-CM | POA: Diagnosis not present

## 2021-09-17 DIAGNOSIS — H2513 Age-related nuclear cataract, bilateral: Secondary | ICD-10-CM | POA: Diagnosis not present

## 2021-09-17 DIAGNOSIS — H40013 Open angle with borderline findings, low risk, bilateral: Secondary | ICD-10-CM | POA: Diagnosis not present

## 2021-09-25 DIAGNOSIS — I69391 Dysphagia following cerebral infarction: Secondary | ICD-10-CM | POA: Diagnosis not present

## 2021-09-25 DIAGNOSIS — I2510 Atherosclerotic heart disease of native coronary artery without angina pectoris: Secondary | ICD-10-CM | POA: Diagnosis not present

## 2021-09-25 DIAGNOSIS — I672 Cerebral atherosclerosis: Secondary | ICD-10-CM | POA: Diagnosis not present

## 2021-09-25 DIAGNOSIS — I6350 Cerebral infarction due to unspecified occlusion or stenosis of unspecified cerebral artery: Secondary | ICD-10-CM | POA: Diagnosis not present

## 2021-10-10 DIAGNOSIS — I69320 Aphasia following cerebral infarction: Secondary | ICD-10-CM | POA: Diagnosis not present

## 2021-10-10 DIAGNOSIS — R279 Unspecified lack of coordination: Secondary | ICD-10-CM | POA: Diagnosis not present

## 2021-10-10 DIAGNOSIS — M6281 Muscle weakness (generalized): Secondary | ICD-10-CM | POA: Diagnosis not present

## 2021-10-10 DIAGNOSIS — I6931 Attention and concentration deficit following cerebral infarction: Secondary | ICD-10-CM | POA: Diagnosis not present

## 2021-10-10 DIAGNOSIS — I69391 Dysphagia following cerebral infarction: Secondary | ICD-10-CM | POA: Diagnosis not present

## 2021-10-11 DIAGNOSIS — I6931 Attention and concentration deficit following cerebral infarction: Secondary | ICD-10-CM | POA: Diagnosis not present

## 2021-10-11 DIAGNOSIS — I69320 Aphasia following cerebral infarction: Secondary | ICD-10-CM | POA: Diagnosis not present

## 2021-10-11 DIAGNOSIS — R279 Unspecified lack of coordination: Secondary | ICD-10-CM | POA: Diagnosis not present

## 2021-10-11 DIAGNOSIS — M6281 Muscle weakness (generalized): Secondary | ICD-10-CM | POA: Diagnosis not present

## 2021-10-11 DIAGNOSIS — I69391 Dysphagia following cerebral infarction: Secondary | ICD-10-CM | POA: Diagnosis not present

## 2021-10-13 DIAGNOSIS — R279 Unspecified lack of coordination: Secondary | ICD-10-CM | POA: Diagnosis not present

## 2021-10-13 DIAGNOSIS — I6931 Attention and concentration deficit following cerebral infarction: Secondary | ICD-10-CM | POA: Diagnosis not present

## 2021-10-13 DIAGNOSIS — M6281 Muscle weakness (generalized): Secondary | ICD-10-CM | POA: Diagnosis not present

## 2021-10-13 DIAGNOSIS — I69391 Dysphagia following cerebral infarction: Secondary | ICD-10-CM | POA: Diagnosis not present

## 2021-10-13 DIAGNOSIS — I69320 Aphasia following cerebral infarction: Secondary | ICD-10-CM | POA: Diagnosis not present

## 2021-10-14 DIAGNOSIS — R279 Unspecified lack of coordination: Secondary | ICD-10-CM | POA: Diagnosis not present

## 2021-10-14 DIAGNOSIS — I6931 Attention and concentration deficit following cerebral infarction: Secondary | ICD-10-CM | POA: Diagnosis not present

## 2021-10-14 DIAGNOSIS — M6281 Muscle weakness (generalized): Secondary | ICD-10-CM | POA: Diagnosis not present

## 2021-10-14 DIAGNOSIS — I69391 Dysphagia following cerebral infarction: Secondary | ICD-10-CM | POA: Diagnosis not present

## 2021-10-14 DIAGNOSIS — I69320 Aphasia following cerebral infarction: Secondary | ICD-10-CM | POA: Diagnosis not present

## 2021-10-15 DIAGNOSIS — R279 Unspecified lack of coordination: Secondary | ICD-10-CM | POA: Diagnosis not present

## 2021-10-15 DIAGNOSIS — I6931 Attention and concentration deficit following cerebral infarction: Secondary | ICD-10-CM | POA: Diagnosis not present

## 2021-10-15 DIAGNOSIS — I69391 Dysphagia following cerebral infarction: Secondary | ICD-10-CM | POA: Diagnosis not present

## 2021-10-15 DIAGNOSIS — M6281 Muscle weakness (generalized): Secondary | ICD-10-CM | POA: Diagnosis not present

## 2021-10-15 DIAGNOSIS — I69320 Aphasia following cerebral infarction: Secondary | ICD-10-CM | POA: Diagnosis not present

## 2021-10-16 DIAGNOSIS — M6281 Muscle weakness (generalized): Secondary | ICD-10-CM | POA: Diagnosis not present

## 2021-10-16 DIAGNOSIS — I6931 Attention and concentration deficit following cerebral infarction: Secondary | ICD-10-CM | POA: Diagnosis not present

## 2021-10-16 DIAGNOSIS — R279 Unspecified lack of coordination: Secondary | ICD-10-CM | POA: Diagnosis not present

## 2021-10-16 DIAGNOSIS — I69320 Aphasia following cerebral infarction: Secondary | ICD-10-CM | POA: Diagnosis not present

## 2021-10-16 DIAGNOSIS — I69391 Dysphagia following cerebral infarction: Secondary | ICD-10-CM | POA: Diagnosis not present

## 2021-10-17 DIAGNOSIS — R279 Unspecified lack of coordination: Secondary | ICD-10-CM | POA: Diagnosis not present

## 2021-10-17 DIAGNOSIS — I6931 Attention and concentration deficit following cerebral infarction: Secondary | ICD-10-CM | POA: Diagnosis not present

## 2021-10-17 DIAGNOSIS — I69391 Dysphagia following cerebral infarction: Secondary | ICD-10-CM | POA: Diagnosis not present

## 2021-10-17 DIAGNOSIS — I69320 Aphasia following cerebral infarction: Secondary | ICD-10-CM | POA: Diagnosis not present

## 2021-10-17 DIAGNOSIS — M6281 Muscle weakness (generalized): Secondary | ICD-10-CM | POA: Diagnosis not present

## 2021-10-20 DIAGNOSIS — I69320 Aphasia following cerebral infarction: Secondary | ICD-10-CM | POA: Diagnosis not present

## 2021-10-20 DIAGNOSIS — I6931 Attention and concentration deficit following cerebral infarction: Secondary | ICD-10-CM | POA: Diagnosis not present

## 2021-10-20 DIAGNOSIS — M6281 Muscle weakness (generalized): Secondary | ICD-10-CM | POA: Diagnosis not present

## 2021-10-20 DIAGNOSIS — R279 Unspecified lack of coordination: Secondary | ICD-10-CM | POA: Diagnosis not present

## 2021-10-20 DIAGNOSIS — I69391 Dysphagia following cerebral infarction: Secondary | ICD-10-CM | POA: Diagnosis not present

## 2021-10-21 DIAGNOSIS — R279 Unspecified lack of coordination: Secondary | ICD-10-CM | POA: Diagnosis not present

## 2021-10-21 DIAGNOSIS — I6931 Attention and concentration deficit following cerebral infarction: Secondary | ICD-10-CM | POA: Diagnosis not present

## 2021-10-21 DIAGNOSIS — I69320 Aphasia following cerebral infarction: Secondary | ICD-10-CM | POA: Diagnosis not present

## 2021-10-21 DIAGNOSIS — I69391 Dysphagia following cerebral infarction: Secondary | ICD-10-CM | POA: Diagnosis not present

## 2021-10-21 DIAGNOSIS — M6281 Muscle weakness (generalized): Secondary | ICD-10-CM | POA: Diagnosis not present

## 2021-10-22 DIAGNOSIS — M6281 Muscle weakness (generalized): Secondary | ICD-10-CM | POA: Diagnosis not present

## 2021-10-22 DIAGNOSIS — I69391 Dysphagia following cerebral infarction: Secondary | ICD-10-CM | POA: Diagnosis not present

## 2021-10-22 DIAGNOSIS — R279 Unspecified lack of coordination: Secondary | ICD-10-CM | POA: Diagnosis not present

## 2021-10-22 DIAGNOSIS — I6931 Attention and concentration deficit following cerebral infarction: Secondary | ICD-10-CM | POA: Diagnosis not present

## 2021-10-22 DIAGNOSIS — I69320 Aphasia following cerebral infarction: Secondary | ICD-10-CM | POA: Diagnosis not present

## 2021-10-23 DIAGNOSIS — I69391 Dysphagia following cerebral infarction: Secondary | ICD-10-CM | POA: Diagnosis not present

## 2021-10-23 DIAGNOSIS — I6931 Attention and concentration deficit following cerebral infarction: Secondary | ICD-10-CM | POA: Diagnosis not present

## 2021-10-23 DIAGNOSIS — M6281 Muscle weakness (generalized): Secondary | ICD-10-CM | POA: Diagnosis not present

## 2021-10-23 DIAGNOSIS — I69320 Aphasia following cerebral infarction: Secondary | ICD-10-CM | POA: Diagnosis not present

## 2021-10-23 DIAGNOSIS — R279 Unspecified lack of coordination: Secondary | ICD-10-CM | POA: Diagnosis not present

## 2021-10-24 DIAGNOSIS — I6931 Attention and concentration deficit following cerebral infarction: Secondary | ICD-10-CM | POA: Diagnosis not present

## 2021-10-24 DIAGNOSIS — R279 Unspecified lack of coordination: Secondary | ICD-10-CM | POA: Diagnosis not present

## 2021-10-24 DIAGNOSIS — M6281 Muscle weakness (generalized): Secondary | ICD-10-CM | POA: Diagnosis not present

## 2021-10-24 DIAGNOSIS — I69320 Aphasia following cerebral infarction: Secondary | ICD-10-CM | POA: Diagnosis not present

## 2021-10-24 DIAGNOSIS — I69391 Dysphagia following cerebral infarction: Secondary | ICD-10-CM | POA: Diagnosis not present

## 2021-10-27 DIAGNOSIS — I69391 Dysphagia following cerebral infarction: Secondary | ICD-10-CM | POA: Diagnosis not present

## 2021-10-27 DIAGNOSIS — I69320 Aphasia following cerebral infarction: Secondary | ICD-10-CM | POA: Diagnosis not present

## 2021-10-27 DIAGNOSIS — I6931 Attention and concentration deficit following cerebral infarction: Secondary | ICD-10-CM | POA: Diagnosis not present

## 2021-10-27 DIAGNOSIS — M6281 Muscle weakness (generalized): Secondary | ICD-10-CM | POA: Diagnosis not present

## 2021-10-27 DIAGNOSIS — R279 Unspecified lack of coordination: Secondary | ICD-10-CM | POA: Diagnosis not present

## 2021-10-28 DIAGNOSIS — I6931 Attention and concentration deficit following cerebral infarction: Secondary | ICD-10-CM | POA: Diagnosis not present

## 2021-10-28 DIAGNOSIS — R279 Unspecified lack of coordination: Secondary | ICD-10-CM | POA: Diagnosis not present

## 2021-10-28 DIAGNOSIS — E785 Hyperlipidemia, unspecified: Secondary | ICD-10-CM | POA: Diagnosis not present

## 2021-10-28 DIAGNOSIS — K219 Gastro-esophageal reflux disease without esophagitis: Secondary | ICD-10-CM | POA: Diagnosis not present

## 2021-10-28 DIAGNOSIS — I10 Essential (primary) hypertension: Secondary | ICD-10-CM | POA: Diagnosis not present

## 2021-10-28 DIAGNOSIS — I69391 Dysphagia following cerebral infarction: Secondary | ICD-10-CM | POA: Diagnosis not present

## 2021-10-28 DIAGNOSIS — I69320 Aphasia following cerebral infarction: Secondary | ICD-10-CM | POA: Diagnosis not present

## 2021-10-28 DIAGNOSIS — M6281 Muscle weakness (generalized): Secondary | ICD-10-CM | POA: Diagnosis not present

## 2021-10-29 DIAGNOSIS — M6281 Muscle weakness (generalized): Secondary | ICD-10-CM | POA: Diagnosis not present

## 2021-10-29 DIAGNOSIS — I6931 Attention and concentration deficit following cerebral infarction: Secondary | ICD-10-CM | POA: Diagnosis not present

## 2021-10-29 DIAGNOSIS — R279 Unspecified lack of coordination: Secondary | ICD-10-CM | POA: Diagnosis not present

## 2021-10-29 DIAGNOSIS — I69320 Aphasia following cerebral infarction: Secondary | ICD-10-CM | POA: Diagnosis not present

## 2021-10-29 DIAGNOSIS — I69391 Dysphagia following cerebral infarction: Secondary | ICD-10-CM | POA: Diagnosis not present

## 2021-10-30 DIAGNOSIS — I69391 Dysphagia following cerebral infarction: Secondary | ICD-10-CM | POA: Diagnosis not present

## 2021-10-30 DIAGNOSIS — I69320 Aphasia following cerebral infarction: Secondary | ICD-10-CM | POA: Diagnosis not present

## 2021-10-30 DIAGNOSIS — I6931 Attention and concentration deficit following cerebral infarction: Secondary | ICD-10-CM | POA: Diagnosis not present

## 2021-10-30 DIAGNOSIS — R279 Unspecified lack of coordination: Secondary | ICD-10-CM | POA: Diagnosis not present

## 2021-10-30 DIAGNOSIS — M6281 Muscle weakness (generalized): Secondary | ICD-10-CM | POA: Diagnosis not present

## 2021-10-31 DIAGNOSIS — R279 Unspecified lack of coordination: Secondary | ICD-10-CM | POA: Diagnosis not present

## 2021-10-31 DIAGNOSIS — M6281 Muscle weakness (generalized): Secondary | ICD-10-CM | POA: Diagnosis not present

## 2021-10-31 DIAGNOSIS — I6931 Attention and concentration deficit following cerebral infarction: Secondary | ICD-10-CM | POA: Diagnosis not present

## 2021-10-31 DIAGNOSIS — I69391 Dysphagia following cerebral infarction: Secondary | ICD-10-CM | POA: Diagnosis not present

## 2021-10-31 DIAGNOSIS — I69320 Aphasia following cerebral infarction: Secondary | ICD-10-CM | POA: Diagnosis not present

## 2021-11-01 DIAGNOSIS — I69320 Aphasia following cerebral infarction: Secondary | ICD-10-CM | POA: Diagnosis not present

## 2021-11-01 DIAGNOSIS — R279 Unspecified lack of coordination: Secondary | ICD-10-CM | POA: Diagnosis not present

## 2021-11-01 DIAGNOSIS — I6931 Attention and concentration deficit following cerebral infarction: Secondary | ICD-10-CM | POA: Diagnosis not present

## 2021-11-01 DIAGNOSIS — I69391 Dysphagia following cerebral infarction: Secondary | ICD-10-CM | POA: Diagnosis not present

## 2021-11-01 DIAGNOSIS — M6281 Muscle weakness (generalized): Secondary | ICD-10-CM | POA: Diagnosis not present

## 2021-11-03 DIAGNOSIS — M6281 Muscle weakness (generalized): Secondary | ICD-10-CM | POA: Diagnosis not present

## 2021-11-03 DIAGNOSIS — I69320 Aphasia following cerebral infarction: Secondary | ICD-10-CM | POA: Diagnosis not present

## 2021-11-03 DIAGNOSIS — R279 Unspecified lack of coordination: Secondary | ICD-10-CM | POA: Diagnosis not present

## 2021-11-03 DIAGNOSIS — I69391 Dysphagia following cerebral infarction: Secondary | ICD-10-CM | POA: Diagnosis not present

## 2021-11-03 DIAGNOSIS — I6931 Attention and concentration deficit following cerebral infarction: Secondary | ICD-10-CM | POA: Diagnosis not present

## 2021-11-04 DIAGNOSIS — I69320 Aphasia following cerebral infarction: Secondary | ICD-10-CM | POA: Diagnosis not present

## 2021-11-04 DIAGNOSIS — I69391 Dysphagia following cerebral infarction: Secondary | ICD-10-CM | POA: Diagnosis not present

## 2021-11-04 DIAGNOSIS — I6931 Attention and concentration deficit following cerebral infarction: Secondary | ICD-10-CM | POA: Diagnosis not present

## 2021-11-04 DIAGNOSIS — R279 Unspecified lack of coordination: Secondary | ICD-10-CM | POA: Diagnosis not present

## 2021-11-04 DIAGNOSIS — M6281 Muscle weakness (generalized): Secondary | ICD-10-CM | POA: Diagnosis not present

## 2021-11-05 DIAGNOSIS — I6931 Attention and concentration deficit following cerebral infarction: Secondary | ICD-10-CM | POA: Diagnosis not present

## 2021-11-05 DIAGNOSIS — R279 Unspecified lack of coordination: Secondary | ICD-10-CM | POA: Diagnosis not present

## 2021-11-05 DIAGNOSIS — M6281 Muscle weakness (generalized): Secondary | ICD-10-CM | POA: Diagnosis not present

## 2021-11-05 DIAGNOSIS — I69320 Aphasia following cerebral infarction: Secondary | ICD-10-CM | POA: Diagnosis not present

## 2021-11-05 DIAGNOSIS — I69391 Dysphagia following cerebral infarction: Secondary | ICD-10-CM | POA: Diagnosis not present

## 2021-11-06 DIAGNOSIS — I69391 Dysphagia following cerebral infarction: Secondary | ICD-10-CM | POA: Diagnosis not present

## 2021-11-06 DIAGNOSIS — R279 Unspecified lack of coordination: Secondary | ICD-10-CM | POA: Diagnosis not present

## 2021-11-06 DIAGNOSIS — I69320 Aphasia following cerebral infarction: Secondary | ICD-10-CM | POA: Diagnosis not present

## 2021-11-06 DIAGNOSIS — M6281 Muscle weakness (generalized): Secondary | ICD-10-CM | POA: Diagnosis not present

## 2021-11-06 DIAGNOSIS — I6931 Attention and concentration deficit following cerebral infarction: Secondary | ICD-10-CM | POA: Diagnosis not present

## 2021-11-07 DIAGNOSIS — M6281 Muscle weakness (generalized): Secondary | ICD-10-CM | POA: Diagnosis not present

## 2021-11-07 DIAGNOSIS — I6931 Attention and concentration deficit following cerebral infarction: Secondary | ICD-10-CM | POA: Diagnosis not present

## 2021-11-07 DIAGNOSIS — I69320 Aphasia following cerebral infarction: Secondary | ICD-10-CM | POA: Diagnosis not present

## 2021-11-07 DIAGNOSIS — I69391 Dysphagia following cerebral infarction: Secondary | ICD-10-CM | POA: Diagnosis not present

## 2021-11-07 DIAGNOSIS — R279 Unspecified lack of coordination: Secondary | ICD-10-CM | POA: Diagnosis not present

## 2021-11-10 DIAGNOSIS — M6281 Muscle weakness (generalized): Secondary | ICD-10-CM | POA: Diagnosis not present

## 2021-11-10 DIAGNOSIS — I69320 Aphasia following cerebral infarction: Secondary | ICD-10-CM | POA: Diagnosis not present

## 2021-11-10 DIAGNOSIS — I6931 Attention and concentration deficit following cerebral infarction: Secondary | ICD-10-CM | POA: Diagnosis not present

## 2021-11-10 DIAGNOSIS — I69391 Dysphagia following cerebral infarction: Secondary | ICD-10-CM | POA: Diagnosis not present

## 2021-11-10 DIAGNOSIS — R279 Unspecified lack of coordination: Secondary | ICD-10-CM | POA: Diagnosis not present

## 2021-11-11 DIAGNOSIS — I6931 Attention and concentration deficit following cerebral infarction: Secondary | ICD-10-CM | POA: Diagnosis not present

## 2021-11-11 DIAGNOSIS — B351 Tinea unguium: Secondary | ICD-10-CM | POA: Diagnosis not present

## 2021-11-11 DIAGNOSIS — I69320 Aphasia following cerebral infarction: Secondary | ICD-10-CM | POA: Diagnosis not present

## 2021-11-11 DIAGNOSIS — M6281 Muscle weakness (generalized): Secondary | ICD-10-CM | POA: Diagnosis not present

## 2021-11-11 DIAGNOSIS — M79674 Pain in right toe(s): Secondary | ICD-10-CM | POA: Diagnosis not present

## 2021-11-11 DIAGNOSIS — R279 Unspecified lack of coordination: Secondary | ICD-10-CM | POA: Diagnosis not present

## 2021-11-11 DIAGNOSIS — L603 Nail dystrophy: Secondary | ICD-10-CM | POA: Diagnosis not present

## 2021-11-11 DIAGNOSIS — I69391 Dysphagia following cerebral infarction: Secondary | ICD-10-CM | POA: Diagnosis not present

## 2021-11-11 DIAGNOSIS — M79675 Pain in left toe(s): Secondary | ICD-10-CM | POA: Diagnosis not present

## 2021-11-12 DIAGNOSIS — R279 Unspecified lack of coordination: Secondary | ICD-10-CM | POA: Diagnosis not present

## 2021-11-12 DIAGNOSIS — I6931 Attention and concentration deficit following cerebral infarction: Secondary | ICD-10-CM | POA: Diagnosis not present

## 2021-11-12 DIAGNOSIS — I69320 Aphasia following cerebral infarction: Secondary | ICD-10-CM | POA: Diagnosis not present

## 2021-11-12 DIAGNOSIS — M6281 Muscle weakness (generalized): Secondary | ICD-10-CM | POA: Diagnosis not present

## 2021-11-12 DIAGNOSIS — I69391 Dysphagia following cerebral infarction: Secondary | ICD-10-CM | POA: Diagnosis not present

## 2021-11-13 DIAGNOSIS — R279 Unspecified lack of coordination: Secondary | ICD-10-CM | POA: Diagnosis not present

## 2021-11-13 DIAGNOSIS — I6931 Attention and concentration deficit following cerebral infarction: Secondary | ICD-10-CM | POA: Diagnosis not present

## 2021-11-13 DIAGNOSIS — R296 Repeated falls: Secondary | ICD-10-CM | POA: Diagnosis not present

## 2021-11-13 DIAGNOSIS — I69391 Dysphagia following cerebral infarction: Secondary | ICD-10-CM | POA: Diagnosis not present

## 2021-11-13 DIAGNOSIS — M6281 Muscle weakness (generalized): Secondary | ICD-10-CM | POA: Diagnosis not present

## 2021-11-13 DIAGNOSIS — R2681 Unsteadiness on feet: Secondary | ICD-10-CM | POA: Diagnosis not present

## 2021-11-13 DIAGNOSIS — I69320 Aphasia following cerebral infarction: Secondary | ICD-10-CM | POA: Diagnosis not present

## 2021-11-14 DIAGNOSIS — R279 Unspecified lack of coordination: Secondary | ICD-10-CM | POA: Diagnosis not present

## 2021-11-14 DIAGNOSIS — I6931 Attention and concentration deficit following cerebral infarction: Secondary | ICD-10-CM | POA: Diagnosis not present

## 2021-11-14 DIAGNOSIS — I69391 Dysphagia following cerebral infarction: Secondary | ICD-10-CM | POA: Diagnosis not present

## 2021-11-14 DIAGNOSIS — M6281 Muscle weakness (generalized): Secondary | ICD-10-CM | POA: Diagnosis not present

## 2021-11-14 DIAGNOSIS — I69320 Aphasia following cerebral infarction: Secondary | ICD-10-CM | POA: Diagnosis not present

## 2021-11-15 DIAGNOSIS — I10 Essential (primary) hypertension: Secondary | ICD-10-CM | POA: Diagnosis not present

## 2021-11-17 DIAGNOSIS — I69391 Dysphagia following cerebral infarction: Secondary | ICD-10-CM | POA: Diagnosis not present

## 2021-11-17 DIAGNOSIS — R279 Unspecified lack of coordination: Secondary | ICD-10-CM | POA: Diagnosis not present

## 2021-11-17 DIAGNOSIS — I69320 Aphasia following cerebral infarction: Secondary | ICD-10-CM | POA: Diagnosis not present

## 2021-11-17 DIAGNOSIS — I6931 Attention and concentration deficit following cerebral infarction: Secondary | ICD-10-CM | POA: Diagnosis not present

## 2021-11-17 DIAGNOSIS — M6281 Muscle weakness (generalized): Secondary | ICD-10-CM | POA: Diagnosis not present

## 2021-11-18 DIAGNOSIS — R279 Unspecified lack of coordination: Secondary | ICD-10-CM | POA: Diagnosis not present

## 2021-11-18 DIAGNOSIS — I69320 Aphasia following cerebral infarction: Secondary | ICD-10-CM | POA: Diagnosis not present

## 2021-11-18 DIAGNOSIS — I6931 Attention and concentration deficit following cerebral infarction: Secondary | ICD-10-CM | POA: Diagnosis not present

## 2021-11-18 DIAGNOSIS — M6281 Muscle weakness (generalized): Secondary | ICD-10-CM | POA: Diagnosis not present

## 2021-11-18 DIAGNOSIS — I69391 Dysphagia following cerebral infarction: Secondary | ICD-10-CM | POA: Diagnosis not present

## 2021-11-19 DIAGNOSIS — I69320 Aphasia following cerebral infarction: Secondary | ICD-10-CM | POA: Diagnosis not present

## 2021-11-19 DIAGNOSIS — R279 Unspecified lack of coordination: Secondary | ICD-10-CM | POA: Diagnosis not present

## 2021-11-19 DIAGNOSIS — M6281 Muscle weakness (generalized): Secondary | ICD-10-CM | POA: Diagnosis not present

## 2021-11-19 DIAGNOSIS — I69391 Dysphagia following cerebral infarction: Secondary | ICD-10-CM | POA: Diagnosis not present

## 2021-11-19 DIAGNOSIS — I6931 Attention and concentration deficit following cerebral infarction: Secondary | ICD-10-CM | POA: Diagnosis not present

## 2021-11-20 DIAGNOSIS — I6931 Attention and concentration deficit following cerebral infarction: Secondary | ICD-10-CM | POA: Diagnosis not present

## 2021-11-20 DIAGNOSIS — I69320 Aphasia following cerebral infarction: Secondary | ICD-10-CM | POA: Diagnosis not present

## 2021-11-20 DIAGNOSIS — M6281 Muscle weakness (generalized): Secondary | ICD-10-CM | POA: Diagnosis not present

## 2021-11-20 DIAGNOSIS — I69391 Dysphagia following cerebral infarction: Secondary | ICD-10-CM | POA: Diagnosis not present

## 2021-11-20 DIAGNOSIS — R279 Unspecified lack of coordination: Secondary | ICD-10-CM | POA: Diagnosis not present

## 2021-11-21 DIAGNOSIS — M6281 Muscle weakness (generalized): Secondary | ICD-10-CM | POA: Diagnosis not present

## 2021-11-21 DIAGNOSIS — I69320 Aphasia following cerebral infarction: Secondary | ICD-10-CM | POA: Diagnosis not present

## 2021-11-21 DIAGNOSIS — R279 Unspecified lack of coordination: Secondary | ICD-10-CM | POA: Diagnosis not present

## 2021-11-21 DIAGNOSIS — I69391 Dysphagia following cerebral infarction: Secondary | ICD-10-CM | POA: Diagnosis not present

## 2021-11-21 DIAGNOSIS — I6931 Attention and concentration deficit following cerebral infarction: Secondary | ICD-10-CM | POA: Diagnosis not present

## 2022-01-22 DEATH — deceased
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
